# Patient Record
Sex: Female | Born: 1990 | Race: Black or African American | Hispanic: No | Marital: Single | Smoking: Current every day smoker
Health system: Southern US, Community
[De-identification: ages and names within clinical notes are randomized; demographics above are authoritative.]

## PROBLEM LIST (undated history)

## (undated) DIAGNOSIS — J45909 Unspecified asthma, uncomplicated: Secondary | ICD-10-CM

## (undated) HISTORY — PX: LIPOMA EXCISION: SHX5283

## (undated) HISTORY — PX: BACK SURGERY: SHX140

---

## 2017-01-02 ENCOUNTER — Other Ambulatory Visit: Payer: Self-pay

## 2017-01-02 ENCOUNTER — Emergency Department (HOSPITAL_BASED_OUTPATIENT_CLINIC_OR_DEPARTMENT_OTHER)
Admission: EM | Admit: 2017-01-02 | Discharge: 2017-01-02 | Disposition: A | Discharge status: Home / Self Care | Payer: Self-pay | Attending: Emergency Medicine | Admitting: Emergency Medicine

## 2017-01-02 ENCOUNTER — Encounter (HOSPITAL_BASED_OUTPATIENT_CLINIC_OR_DEPARTMENT_OTHER): Payer: Self-pay | Admitting: *Deleted

## 2017-01-02 DIAGNOSIS — J069 Acute upper respiratory infection, unspecified: Secondary | ICD-10-CM | POA: Insufficient documentation

## 2017-01-02 DIAGNOSIS — J45909 Unspecified asthma, uncomplicated: Secondary | ICD-10-CM | POA: Insufficient documentation

## 2017-01-02 DIAGNOSIS — B349 Viral infection, unspecified: Secondary | ICD-10-CM | POA: Insufficient documentation

## 2017-01-02 DIAGNOSIS — R05 Cough: Secondary | ICD-10-CM

## 2017-01-02 DIAGNOSIS — F17210 Nicotine dependence, cigarettes, uncomplicated: Secondary | ICD-10-CM | POA: Insufficient documentation

## 2017-01-02 DIAGNOSIS — R059 Cough, unspecified: Secondary | ICD-10-CM

## 2017-01-02 HISTORY — DX: Unspecified asthma, uncomplicated: J45.909

## 2017-01-02 MED ORDER — ALBUTEROL SULFATE HFA 108 (90 BASE) MCG/ACT IN AERS
2.0000 | INHALATION_SPRAY | Freq: Once | RESPIRATORY_TRACT | Status: AC
  Administered 2017-01-02: 2 via RESPIRATORY_TRACT
  Filled 2017-01-02: qty 6.7

## 2017-01-02 MED ORDER — IPRATROPIUM-ALBUTEROL 0.5-2.5 (3) MG/3ML IN SOLN
RESPIRATORY_TRACT | Status: AC
  Filled 2017-01-02: qty 3

## 2017-01-02 MED ORDER — BENZONATATE 100 MG PO CAPS: 100.0000 mg | ORAL_CAPSULE | Freq: Three times a day (TID) | ORAL | 0 refills | Status: DC

## 2017-01-02 MED ORDER — ALBUTEROL (5 MG/ML) CONTINUOUS INHALATION SOLN
INHALATION_SOLUTION | RESPIRATORY_TRACT | Status: AC
  Filled 2017-01-02: qty 0.5

## 2017-01-02 MED ORDER — PREDNISONE 20 MG PO TABS: 20.0000 mg | ORAL_TABLET | Freq: Every day | ORAL | 0 refills | Status: AC

## 2017-01-02 MED ORDER — ALBUTEROL SULFATE HFA 108 (90 BASE) MCG/ACT IN AERS: 1.0000 | INHALATION_SPRAY | Freq: Four times a day (QID) | RESPIRATORY_TRACT | 0 refills | Status: DC | PRN

## 2017-01-02 NOTE — ED Provider Notes (Signed)
Emergency Department Provider Note   I have reviewed the triage vital signs and the nursing notes.   HISTORY  Chief Complaint URI   HPI Hannah Haney is a 26 y.o. female with PMH of asthma presents to the emergency department for evaluation of cough, congestion, headache, sore throat, body aches over the past 3 days.  No recorded fevers at home.  No shaking chills.  She has a history of asthma and has been using her albuterol inhaler as needed with mild relief and cough symptoms.  She describes a burning sensation in her chest after coughing that resolves when she is not coughing.  Cough is nonproductive.  No hemoptysis.  No associated vomiting or nausea.  No sudden onset, maximal intensity headache symptoms.    Past Medical History:  Diagnosis Date  . Asthma     There are no active problems to display for this patient.   History reviewed. No pertinent surgical history.  Current Outpatient Rx  . Order #: [id number] Class: Historical Med  . Order #: [id number] Class: Historical Med  . Order #: [id number] Class: Print  . Order #: [id number] Class: Print  . Order #: [id number] Class: Print    Allergies Patient has no known allergies.  No family history on file.  Social History Social History   Tobacco Use  . Smoking status: Current Every Day Smoker    Packs/day: 0.50  Substance Use Topics  . Alcohol use: No    Frequency: Never  . Drug use: No    Review of Systems  Constitutional: No fever/chills. Positive body aches.  Eyes: No visual changes. ENT: Positive sore throat. Cardiovascular: Positive burning chest pain with cough.  Respiratory: Denies shortness of breath. Positive cough.  Gastrointestinal: No abdominal pain.  No nausea, no vomiting.  No diarrhea.  No constipation. Genitourinary: Negative for dysuria. Musculoskeletal: Negative for back pain. Skin: Negative for rash. Neurological: Negative for headaches, focal weakness or numbness.  10-point ROS  otherwise negative.  ____________________________________________   PHYSICAL EXAM:  VITAL SIGNS: ED Triage Vitals  Enc Vitals Group     BP 01/02/17 1742 117/79     Pulse Rate 01/02/17 1742 97     Resp 01/02/17 1742 16     Temp 01/02/17 1742 98.5 F (36.9 C)     Temp Source 01/02/17 1742 Oral     SpO2 01/02/17 1742 100 %     Weight 01/02/17 1740 240 lb (108.9 kg)     Height 01/02/17 1740 5\' 3"  (1.6 m)    Constitutional: Alert and oriented. Well appearing and in no acute distress. Eyes: Conjunctivae are normal.  Head: Atraumatic. Nose: No congestion/rhinnorhea. Mouth/Throat: Mucous membranes are moist.  Oropharynx with mild erythema.  Neck: No stridor. Mild cervical adenopathy.  Cardiovascular: Normal rate, regular rhythm. Good peripheral circulation. Grossly normal heart sounds.   Respiratory: Normal respiratory effort.  No retractions. Lungs CTAB. Gastrointestinal: Soft and nontender. No distention.  Musculoskeletal: No lower extremity tenderness nor edema. No gross deformities of extremities. Neurologic:  Normal speech and language. No gross focal neurologic deficits are appreciated.  Skin:  Skin is warm, dry and intact. No rash noted.  ____________________________________________  RADIOLOGY  None ____________________________________________   PROCEDURES  Procedure(s) performed:   Procedures  None ____________________________________________   INITIAL IMPRESSION / ASSESSMENT AND PLAN / ED COURSE  Pertinent labs & imaging results that were available during my care of the patient were reviewed by me and considered in my medical decision making (see chart for details).  Patient presents  to the emergency department for evaluation of cough, headache, sore throat, body aches over the past 3 days.  No hypoxemia.  Lungs are clear to auscultation bilaterally.  Not feel the patient would benefit from chest x-ray at this time given the clinical presentation.  Much more  consistent with viral infection, likely viral bronchitis.  Patient is outside of the window to benefit from Tamiflu, this was influenza.  Patient is very well-appearing.  Plan for albuterol inhaler in the emergency department with discharge home with short steroid course, albuterol, Tessalon Perles, and NSAIDs as needed for body aches.  Suspect that patient may be experiencing mild asthma flare symptoms in the setting of bronchitis.  At this time, I do not feel there is any life-threatening condition present. I have reviewed and discussed all results (EKG, imaging, lab, urine as appropriate), exam findings with patient. I have reviewed nursing notes and appropriate previous records.  I feel the patient is safe to be discharged home without further emergent workup. Discussed usual and customary return precautions. Patient and family (if present) verbalize understanding and are comfortable with this plan.  Patient will follow-up with their primary care provider. If they do not have a primary care provider, information for follow-up has been provided to them. All questions have been answered.   ____________________________________________  FINAL CLINICAL IMPRESSION(S) / ED DIAGNOSES  Final diagnoses:  Viral upper respiratory tract infection  Cough     MEDICATIONS GIVEN DURING THIS VISIT:  Medications  albuterol (PROVENTIL, VENTOLIN) (5 MG/ML) 0.5% continuous inhalation solution (not administered)  albuterol (PROVENTIL HFA;VENTOLIN HFA) 108 (90 Base) MCG/ACT inhaler 2 puff (not administered)     NEW OUTPATIENT MEDICATIONS STARTED DURING THIS VISIT:  Prednisone, Tessalon, Albuterol   Note:  This document was prepared using Dragon voice recognition software and may include unintentional dictation errors.  Alona BeneJoshua Natale Barba, MD Emergency Medicine    Larisha Vencill, Arlyss RepressJoshua G, MD 01/02/17 [phone]

## 2017-01-02 NOTE — Discharge Instructions (Signed)

## 2017-01-02 NOTE — ED Notes (Signed)
ED Provider at bedside. 

## 2017-01-02 NOTE — ED Triage Notes (Signed)
Pt c/o URI symptoms x 3 days 

## 2017-04-08 ENCOUNTER — Other Ambulatory Visit: Payer: Self-pay

## 2017-04-08 ENCOUNTER — Encounter (HOSPITAL_BASED_OUTPATIENT_CLINIC_OR_DEPARTMENT_OTHER): Payer: Self-pay | Admitting: Emergency Medicine

## 2017-04-08 ENCOUNTER — Emergency Department (HOSPITAL_BASED_OUTPATIENT_CLINIC_OR_DEPARTMENT_OTHER)
Admission: EM | Admit: 2017-04-08 | Discharge: 2017-04-09 | Disposition: A | Discharge status: Home / Self Care | Payer: Self-pay | Attending: Emergency Medicine | Admitting: Emergency Medicine

## 2017-04-08 DIAGNOSIS — F17200 Nicotine dependence, unspecified, uncomplicated: Secondary | ICD-10-CM | POA: Insufficient documentation

## 2017-04-08 DIAGNOSIS — H1011 Acute atopic conjunctivitis, right eye: Secondary | ICD-10-CM | POA: Insufficient documentation

## 2017-04-08 DIAGNOSIS — J45909 Unspecified asthma, uncomplicated: Secondary | ICD-10-CM | POA: Insufficient documentation

## 2017-04-08 DIAGNOSIS — Z79899 Other long term (current) drug therapy: Secondary | ICD-10-CM | POA: Insufficient documentation

## 2017-04-08 NOTE — ED Triage Notes (Signed)
L eye pain and redness x 2 days.

## 2017-04-09 ENCOUNTER — Encounter (HOSPITAL_BASED_OUTPATIENT_CLINIC_OR_DEPARTMENT_OTHER): Payer: Self-pay | Admitting: Emergency Medicine

## 2017-04-09 MED ORDER — ERYTHROMYCIN 5 MG/GM OP OINT: TOPICAL_OINTMENT | OPHTHALMIC | 0 refills | Status: DC

## 2017-04-09 MED ORDER — TETRACAINE HCL 0.5 % OP SOLN
OPHTHALMIC | Status: AC
  Filled 2017-04-09: qty 4

## 2017-04-09 MED ORDER — TETRACAINE HCL 0.5 % OP SOLN
2.0000 [drp] | Freq: Once | OPHTHALMIC | Status: AC
  Administered 2017-04-09: 2 [drp] via OPHTHALMIC

## 2017-04-09 MED ORDER — FLUORESCEIN SODIUM 1 MG OP STRP
1.0000 | ORAL_STRIP | Freq: Once | OPHTHALMIC | Status: AC
  Administered 2017-04-09: 1 via OPHTHALMIC

## 2017-04-09 MED ORDER — FLUORESCEIN SODIUM 1 MG OP STRP
ORAL_STRIP | OPHTHALMIC | Status: AC
  Filled 2017-04-09: qty 1

## 2017-04-09 NOTE — ED Provider Notes (Signed)
[facility] EMERGENCY DEPARTMENT Provider Note   CSN: [id number] Arrival date & time: 04/08/17  2328     History   Chief Complaint Chief Complaint  Patient presents with  . Eye Problem    HPI Hannah Haney is a [age] y.o. female.  The history is provided by the patient.  Eye Problem   This is a new problem. The current episode started 12 to 24 hours ago. The problem occurs constantly. The problem has not changed since onset.There is a problem in the left eye. There was no injury mechanism. The pain is at a severity of 0/10. There is no history of trauma to the eye. There is no known exposure to pink eye. She does not wear contacts. Pertinent negatives include no numbness, no blurred vision, no decreased vision, no discharge, no double vision, no foreign body sensation, no photophobia, no eye redness, no nausea, no vomiting, no tingling, no weakness and no itching. She has tried nothing for the symptoms. The treatment provided no relief.    Past Medical History:  Diagnosis Date  . Asthma     There are no active problems to display for this patient.   Past Surgical History:  Procedure Laterality Date  . LIPOMA EXCISION      OB History    No data available       Home Medications    Prior to Admission medications   Medication Sig Start Date End Date Taking? Authorizing Provider  albuterol (PROVENTIL HFA;VENTOLIN HFA) 108 (90 Base) MCG/ACT inhaler Inhale 2 puffs into the lungs every 6 (six) hours as needed for wheezing or shortness of breath.    [provider]  albuterol (PROVENTIL HFA;VENTOLIN HFA) 108 (90 Base) MCG/ACT inhaler Inhale 1-2 puffs into the lungs every 6 (six) hours as needed for wheezing or shortness of breath. 01/02/17   Long, Arlyss RepressJoshua G, MD  benzonatate (TESSALON) 100 MG capsule Take 1 capsule (100 mg total) by mouth every 8 (eight) hours. 01/02/17   Long, Arlyss RepressJoshua G, MD  erythromycin ophthalmic ointment Place a 1/2 inch ribbon of ointment  into the lower eyelid bid. 04/09/17   Rolande Moe, MD  fluticasone (FLOVENT HFA) 220 MCG/ACT inhaler Inhale 2 puffs into the lungs 2 (two) times daily.    [provider]    Family History No family history on file.  Social History Social History   Tobacco Use  . Smoking status: Current Every Day Smoker    Packs/day: 0.50  . Smokeless tobacco: Never Used  Substance Use Topics  . Alcohol use: Yes    Frequency: Never  . Drug use: No     Allergies   Patient has no known allergies.   Review of Systems Review of Systems  Constitutional: Negative for fever.  HENT: Negative for ear pain and voice change.   Eyes: Positive for itching. Negative for blurred vision, double vision, photophobia, pain, discharge, redness and visual disturbance.  Respiratory: Negative for cough.   Cardiovascular: Negative for chest pain.  Gastrointestinal: Negative for nausea and vomiting.  Skin: Negative for itching.  Neurological: Negative for tingling, weakness and numbness.  All other systems reviewed and are negative.    Physical Exam Updated Vital Signs BP 125/74 (BP Location: Left Arm)   Pulse 98   Temp 99.6 F (37.6 C) (Oral)   Resp 16   Ht 5\' 3"  (1.6 m)   Wt 117.9 kg (260 lb)   LMP 03/31/2017   SpO2 98%   BMI 46.06  kg/m   Physical Exam  Constitutional: She is oriented to person, place, and time. She appears well-developed and well-nourished. No distress.  HENT:  Head: Normocephalic and atraumatic.  Mouth/Throat: No oropharyngeal exudate.  Eyes: EOM are normal. Pupils are equal, round, and reactive to light. Left eye exhibits no chemosis, no discharge and no exudate. Left conjunctiva is injected. Left conjunctiva has no hemorrhage. Left eye exhibits normal extraocular motion and no nystagmus.    Neck: Normal range of motion. Neck supple.  Cardiovascular: Normal rate, regular rhythm, normal heart sounds and intact distal pulses.  Pulmonary/Chest: Effort normal and  breath sounds normal. No stridor. She has no wheezes.  Abdominal: Soft. Bowel sounds are normal. There is no tenderness. There is no rebound and no guarding.  Musculoskeletal: Normal range of motion.  Neurological: She is alert and oriented to person, place, and time. She displays normal reflexes.  Skin: Skin is warm and dry. Capillary refill takes less than 2 seconds.  Psychiatric: She has a normal mood and affect.  Nursing note and vitals reviewed.    ED Treatments / Results    Procedures Procedures (including critical care time)  Medications Ordered in ED Medications  fluorescein ophthalmic strip 1 strip (1 strip Right Eye Given by Other 04/09/17 0014)  tetracaine (PONTOCAINE) 0.5 % ophthalmic solution 2 drop (2 drops Right Eye Given by Other 04/09/17 0014)       Final Clinical Impressions(s) / ED Diagnoses   Final diagnoses:  Allergic conjunctivitis of right eye   Return for weakness, numbness, changes in vision or speech, fevers >100.4 unrelieved by medication, shortness of breath, intractable vomiting, or diarrhea, abdominal pain, Inability to tolerate liquids or food, cough, altered mental status or any concerns. No signs of systemic illness or infection. The patient is nontoxic-appearing on exam and vital signs are within normal limits.   I have reviewed the triage vital signs and the nursing notes. Pertinent labs &imaging results that were available during my care of the patient were reviewed by me and considered in my medical decision making (see chart for details).  After history, exam, and medical workup I feel the patient has been appropriately medically screened and is safe for discharge home. Pertinent diagnoses were discussed with the patient. Patient was given return precautions.   ED Discharge Orders        Ordered    erythromycin ophthalmic ointment     04/09/17 0019       Brooklyn Alfredo, MD 04/09/17 [id number]

## 2017-09-30 ENCOUNTER — Emergency Department (HOSPITAL_COMMUNITY): Payer: 59

## 2017-09-30 ENCOUNTER — Encounter (HOSPITAL_COMMUNITY): Payer: Self-pay | Admitting: Emergency Medicine

## 2017-09-30 ENCOUNTER — Emergency Department (HOSPITAL_COMMUNITY)
Admission: EM | Admit: 2017-09-30 | Discharge: 2017-09-30 | Disposition: A | Discharge status: Home / Self Care | Payer: 59 | Attending: Emergency Medicine | Admitting: Emergency Medicine

## 2017-09-30 DIAGNOSIS — F17200 Nicotine dependence, unspecified, uncomplicated: Secondary | ICD-10-CM | POA: Insufficient documentation

## 2017-09-30 DIAGNOSIS — M549 Dorsalgia, unspecified: Secondary | ICD-10-CM | POA: Diagnosis present

## 2017-09-30 DIAGNOSIS — M546 Pain in thoracic spine: Secondary | ICD-10-CM | POA: Diagnosis not present

## 2017-09-30 DIAGNOSIS — Z79899 Other long term (current) drug therapy: Secondary | ICD-10-CM | POA: Insufficient documentation

## 2017-09-30 DIAGNOSIS — J45909 Unspecified asthma, uncomplicated: Secondary | ICD-10-CM | POA: Diagnosis not present

## 2017-09-30 MED ORDER — METHOCARBAMOL 500 MG PO TABS: 500.0000 mg | ORAL_TABLET | Freq: Two times a day (BID) | ORAL | 0 refills | Status: DC

## 2017-09-30 MED ORDER — KETOROLAC TROMETHAMINE 60 MG/2ML IM SOLN
30.0000 mg | Freq: Once | INTRAMUSCULAR | Status: AC
  Administered 2017-09-30: 30 mg via INTRAMUSCULAR
  Filled 2017-09-30: qty 2

## 2017-09-30 MED ORDER — PREDNISONE 10 MG PO TABS: 40.0000 mg | ORAL_TABLET | Freq: Every day | ORAL | 0 refills | Status: DC

## 2017-09-30 NOTE — ED Provider Notes (Signed)
[facility]-EMERGENCY DEPT Provider Note   CSN: [id number] Arrival date & time: 09/30/17  1749     History   Chief Complaint Chief Complaint  Patient presents with  . Back Pain    HPI Hannah Haney is a 27 y.o. female who presents to the ED with back pain. The back pain is located under the shoulder blades and started before over a month ago. Patient reports having been evaluated at [facility] at the beginning of August for same.   HPI  Past Medical History:  Diagnosis Date  . Asthma     There are no active problems to display for this patient.   Past Surgical History:  Procedure Laterality Date  . LIPOMA EXCISION       OB History   None      Home Medications    Prior to Admission medications   Medication Sig Start Date End Date Taking? Authorizing Provider  albuterol (PROVENTIL HFA;VENTOLIN HFA) 108 (90 Base) MCG/ACT inhaler Inhale 2 puffs into the lungs every 6 (six) hours as needed for wheezing or shortness of breath.    [provider]  albuterol (PROVENTIL HFA;VENTOLIN HFA) 108 (90 Base) MCG/ACT inhaler Inhale 1-2 puffs into the lungs every 6 (six) hours as needed for wheezing or shortness of breath. 01/02/17   Long, Arlyss Repress, MD  benzonatate (TESSALON) 100 MG capsule Take 1 capsule (100 mg total) by mouth every 8 (eight) hours. 01/02/17   Long, Arlyss Repress, MD  erythromycin ophthalmic ointment Place a 1/2 inch ribbon of ointment into the lower eyelid bid. 04/09/17   Palumbo, April, MD  fluticasone (FLOVENT HFA) 220 MCG/ACT inhaler Inhale 2 puffs into the lungs 2 (two) times daily.    [provider]  methocarbamol (ROBAXIN) 500 MG tablet Take 1 tablet (500 mg total) by mouth 2 (two) times daily. 09/30/17   Janne Napoleon, NP  predniSONE (DELTASONE) 10 MG tablet Take 4 tablets (40 mg total) by mouth daily. 09/30/17   Janne Napoleon, NP    Family History No family history on file.  Social History Social History   Tobacco Use  .  Smoking status: Current Every Day Smoker    Packs/day: 0.50  . Smokeless tobacco: Never Used  Substance Use Topics  . Alcohol use: Yes    Frequency: Never  . Drug use: No     Allergies   Patient has no known allergies.   Review of Systems Review of Systems  Musculoskeletal: Positive for back pain.  All other systems reviewed and are negative.    Physical Exam Updated Vital Signs BP 132/77   Pulse 76   Temp 98.7 F (37.1 C) (Oral)   Resp 15   LMP 08/14/2017   SpO2 98%   Physical Exam  Constitutional: She appears well-developed and well-nourished. No distress.  HENT:  Head: Normocephalic.  Eyes: EOM are normal.  Neck: Neck supple.  Cardiovascular: Normal rate.  Pulmonary/Chest: Effort normal.  Abdominal: Soft. There is no tenderness.  Musculoskeletal:       Thoracic back: She exhibits tenderness and spasm. She exhibits normal pulse.  Lipoma noted to the right lower back.  Neurological: She is alert. She has normal strength and normal reflexes. No sensory deficit. Gait normal.  Skin: Skin is warm and dry.  Psychiatric: She has a normal mood and affect.  Nursing note and vitals reviewed.    ED Treatments / Results  Labs (all labs ordered are listed, but only abnormal results  are displayed) Labs Reviewed - No data to display Radiology Dg Thoracic Spine 2 View  Result Date: 09/30/2017 CLINICAL DATA:  Mid back pain x1 month. EXAM: THORACIC SPINE 2 VIEWS COMPARISON:  None. FINDINGS: There is no evidence of thoracic spine fracture. Twelve ribbed thoracic vertebrae are noted. Alignment is normal. Slight T3-4 and T4-5 disc flattening. No other significant bone abnormalities are identified. IMPRESSION: Slight upper thoracic disc flattening T3 through T5. No acute osseous abnormality. Electronically Signed   By: Tollie Eth M.D.   On: 09/30/2017 20:08    Procedures Procedures (including critical care time)  Medications Ordered in ED Medications  ketorolac (TORADOL)  injection 30 mg (has no administration in time range)     Initial Impression / Assessment and Plan / ED Course  I have reviewed the triage vital signs and the nursing notes. Patient with back pain.  No neurological deficits and normal neuro exam.  Patient can walk but states is painful.  No loss of bowel or bladder control.  No concern for cauda equina.  No fever, night sweats, weight loss, h/o cancer, IVDU.  RICE protocol and pain medicine indicated and discussed with patient.   Final Clinical Impressions(s) / ED Diagnoses   Final diagnoses:  Acute right-sided thoracic back pain    ED Discharge Orders         Ordered    predniSONE (DELTASONE) 10 MG tablet  Daily     09/30/17 2013    methocarbamol (ROBAXIN) 500 MG tablet  2 times daily     09/30/17 2013           Kerrie [place], [place] 09/30/17 2025    Benjiman Core, MD 10/01/17 [phone]

## 2017-09-30 NOTE — ED Triage Notes (Signed)
Pt c/o mid back pain on spine under shoulder blades since before August. Pt reports she has been seen at [facility] at the beginning of August for same pain.

## 2018-06-15 ENCOUNTER — Emergency Department (HOSPITAL_COMMUNITY)
Admission: EM | Admit: 2018-06-15 | Discharge: 2018-06-15 | Disposition: A | Discharge status: Home / Self Care | Payer: 59 | Attending: Emergency Medicine | Admitting: Emergency Medicine

## 2018-06-15 ENCOUNTER — Other Ambulatory Visit: Payer: Self-pay

## 2018-06-15 ENCOUNTER — Encounter (HOSPITAL_COMMUNITY): Payer: Self-pay | Admitting: Emergency Medicine

## 2018-06-15 ENCOUNTER — Emergency Department (HOSPITAL_COMMUNITY): Payer: 59

## 2018-06-15 DIAGNOSIS — R1030 Lower abdominal pain, unspecified: Secondary | ICD-10-CM | POA: Diagnosis present

## 2018-06-15 DIAGNOSIS — F17210 Nicotine dependence, cigarettes, uncomplicated: Secondary | ICD-10-CM | POA: Diagnosis not present

## 2018-06-15 LAB — I-STAT BETA HCG BLOOD, ED (MC, WL, AP ONLY): I-stat hCG, quantitative: <5 m[IU]/mL (ref <5)

## 2018-06-15 LAB — URINALYSIS, ROUTINE W REFLEX MICROSCOPIC
Bilirubin Urine: NEGATIVE
Glucose, UA: NEGATIVE mg/dL
Ketones, ur: NEGATIVE mg/dL
Leukocytes,Ua: NEGATIVE
Nitrite: NEGATIVE
Protein, ur: NEGATIVE mg/dL
Specific Gravity, Urine: 1.026 (ref 1.005–1.030)
pH: 5 (ref 5.0–8.0)

## 2018-06-15 LAB — WET PREP, GENITAL
Clue Cells Wet Prep HPF POC: NONE SEEN
Sperm: NONE SEEN
Trich, Wet Prep: NONE SEEN
Yeast Wet Prep HPF POC: NONE SEEN

## 2018-06-15 LAB — COMPREHENSIVE METABOLIC PANEL
ALT: 23 U/L (ref 0–44)
AST: 19 U/L (ref 15–41)
Albumin: 3.5 g/dL (ref 3.5–5.0)
Alkaline Phosphatase: 68 U/L (ref 38–126)
Anion gap: 8 (ref 5–15)
BUN: 11 mg/dL (ref 6–20)
CO2: 25 mmol/L (ref 22–32)
Calcium: 9.2 mg/dL (ref 8.9–10.3)
Chloride: 106 mmol/L (ref 98–111)
Creatinine, Ser: 0.76 mg/dL (ref 0.44–1.00)
GFR calc Af Amer: >60 mL/min (ref >60)
GFR calc non Af Amer: >60 mL/min (ref >60)
Glucose, Bld: 100 mg/dL — ABNORMAL HIGH (ref 70–99)
Potassium: 4.2 mmol/L (ref 3.5–5.1)
Sodium: 139 mmol/L (ref 135–145)
Total Bilirubin: 0.5 mg/dL (ref 0.3–1.2)
Total Protein: 7.1 g/dL (ref 6.5–8.1)

## 2018-06-15 LAB — CBC
HCT: 43.9 % (ref 36.0–46.0)
Hemoglobin: 13.7 g/dL (ref 12.0–15.0)
MCH: 26 pg (ref 26.0–34.0)
MCHC: 31.2 g/dL (ref 30.0–36.0)
MCV: 83.3 fL (ref 80.0–100.0)
Platelets: 463 10*3/uL — ABNORMAL HIGH (ref 150–400)
RBC: 5.27 MIL/uL — ABNORMAL HIGH (ref 3.87–5.11)
RDW: 14 % (ref 11.5–15.5)
WBC: 18 10*3/uL — ABNORMAL HIGH (ref 4.0–10.5)
nRBC: 0 % (ref 0.0–0.2)

## 2018-06-15 LAB — LIPASE, BLOOD: Lipase: 29 U/L (ref 11–51)

## 2018-06-15 MED ORDER — SODIUM CHLORIDE 0.9% FLUSH
3.0000 mL | Freq: Once | INTRAVENOUS | Status: AC
  Administered 2018-06-15: 3 mL via INTRAVENOUS

## 2018-06-15 MED ORDER — DICYCLOMINE HCL 20 MG PO TABS: 20.0000 mg | ORAL_TABLET | Freq: Two times a day (BID) | ORAL | 0 refills | Status: DC

## 2018-06-15 MED ORDER — IOHEXOL 300 MG/ML  SOLN
100.0000 mL | Freq: Once | INTRAMUSCULAR | Status: AC | PRN
  Administered 2018-06-15: 100 mL via INTRAVENOUS

## 2018-06-15 MED ORDER — POLYETHYLENE GLYCOL 3350 17 G PO PACK: 17.0000 g | PACK | Freq: Every day | ORAL | 0 refills | Status: DC

## 2018-06-15 NOTE — Discharge Instructions (Signed)
Take MiraLAX daily mixed with Gatorade until you are having regular bowel movements.  Take Bentyl twice daily as needed for abdominal cramping.  Please follow-up with your primary care provider by calling the number circled on your discharge paperwork.  Please follow-up with an OB/GYN by calling the women's outpatient clinic.  Please return to the emergency department if you develop any new or worsening symptoms.  Your lipoma was seen on CT today and it was recommended that you have it imaged again in 3 to 4 months to assess its stability.

## 2018-06-15 NOTE — ED Notes (Signed)
Patient transported to CT 

## 2018-06-15 NOTE — ED Provider Notes (Signed)
[facility] EMERGENCY DEPARTMENT Provider Note   CSN: [id number] Arrival date & time: 06/15/18  1541    History   Chief Complaint Chief Complaint  Patient presents with   Abdominal Pain    HPI Hannah Haney is a 28 y.o. female with history of asthma who presents with a 4-day history of lower abdominal pain.  Patient describes the pain as crampy.  It has radiated to her back at times.  She denies any associated fevers, chest pain, shortness of breath, nausea, vomiting, diarrhea, abnormal vaginal bleeding or discharge.  She is sexually active, but has no concern for STD exposure.  LMP 06/06/2018.  She has taken ibuprofen at home with some relief.  She denies any urinary symptoms.     HPI  Past Medical History:  Diagnosis Date   Asthma     There are no active problems to display for this patient.   Past Surgical History:  Procedure Laterality Date   LIPOMA EXCISION       OB History   No obstetric history on file.      Home Medications    Prior to Admission medications   Medication Sig Start Date End Date Taking? Authorizing Provider  dicyclomine (BENTYL) 20 MG tablet Take 1 tablet (20 mg total) by mouth 2 (two) times daily. 06/15/18   Loyola Santino, Waylan Boga, PA-C  polyethylene glycol (MIRALAX) 17 g packet Take 17 g by mouth daily. 06/15/18   Emi Holes, PA-C    Family History History reviewed. No pertinent family history.  Social History Social History   Tobacco Use   Smoking status: Current Every Day Smoker    Packs/day: 0.50   Smokeless tobacco: Never Used  Substance Use Topics   Alcohol use: Yes    Frequency: Never   Drug use: No     Allergies   Patient has no known allergies.   Review of Systems Review of Systems  Constitutional: Negative for chills and fever.  HENT: Negative for facial swelling and sore throat.   Respiratory: Negative for shortness of breath.   Cardiovascular: Negative for chest pain.    Gastrointestinal: Positive for abdominal pain. Negative for nausea and vomiting.  Genitourinary: Negative for dysuria.  Musculoskeletal: Positive for back pain.  Skin: Negative for rash and wound.  Neurological: Negative for headaches.  Psychiatric/Behavioral: The patient is not nervous/anxious.      Physical Exam Updated Vital Signs BP 119/68 (BP Location: Right Arm)    Pulse 60    Temp 98.9 F (37.2 C) (Oral)    Resp 16    Ht  (1.6 m)    Wt 127 kg    LMP 06/06/2018    SpO2 100%    BMI 49.60 kg/m   Physical Exam Vitals signs and nursing note reviewed.  Constitutional:      General: She is not in acute distress.    Appearance: She is well-developed. She is not diaphoretic.  HENT:     Head: Normocephalic and atraumatic.     Mouth/Throat:     Pharynx: No oropharyngeal exudate.  Eyes:     General: No scleral icterus.       Right eye: No discharge.        Left eye: No discharge.     Conjunctiva/sclera: Conjunctivae normal.     Pupils: Pupils are equal, round, and reactive to light.  Neck:     Musculoskeletal: Normal range of motion and neck supple.     Thyroid:  No thyromegaly.  Cardiovascular:     Rate and Rhythm: Normal rate and regular rhythm.     Heart sounds: Normal heart sounds. No murmur. No friction rub. No gallop.   Pulmonary:     Effort: Pulmonary effort is normal. No respiratory distress.     Breath sounds: Normal breath sounds. No stridor. No wheezing or rales.  Abdominal:     General: Bowel sounds are normal. There is no distension.     Palpations: Abdomen is soft.     Tenderness: There is abdominal tenderness (R>L) in the right lower quadrant and left lower quadrant. There is no right CVA tenderness, left CVA tenderness, guarding or rebound. Positive signs include McBurney's sign (patient winces with RLQ palpation and not LLQ). Negative signs include Rovsing's sign.  Lymphadenopathy:     Cervical: No cervical adenopathy.  Skin:    General: Skin is warm  and dry.     Coloration: Skin is not pale.     Findings: No rash.  Neurological:     Mental Status: She is alert.     Coordination: Coordination normal.      ED Treatments / Results  Labs (all labs ordered are listed, but only abnormal results are displayed) Labs Reviewed  WET PREP, GENITAL - Abnormal; Notable for the following components:      Result Value   WBC, Wet Prep HPF POC MODERATE (*)    All other components within normal limits  COMPREHENSIVE METABOLIC PANEL - Abnormal; Notable for the following components:   Glucose, Bld 100 (*)    All other components within normal limits  CBC - Abnormal; Notable for the following components:   WBC 18.0 (*)    RBC 5.27 (*)    Platelets 463 (*)    All other components within normal limits  URINALYSIS, ROUTINE W REFLEX MICROSCOPIC - Abnormal; Notable for the following components:   APPearance HAZY (*)    Hgb urine dipstick SMALL (*)    Bacteria, UA RARE (*)    All other components within normal limits  LIPASE, BLOOD  I-STAT BETA HCG BLOOD, ED ([facility], [facility], [facility] ONLY)  GC/CHLAMYDIA PROBE AMP ([place]) NOT AT [facility]    EKG None  Radiology US Transvaginal Non-ob  Result Date: 06/15/2018 CLINICAL DATA:  28 year old female with pain and cramping for 4 days. Unrevealing CT Abdomen and Pelvis earlier today. LMP 06/04/2012. EXAM: TRANSABDOMINAL AND TRANSVAGINAL ULTRASOUND OF PELVIS DOPPLER ULTRASOUND OF OVARIES TECHNIQUE: Both transabdominal and transvaginal ultrasound examinations of the pelvis were performed. Transabdominal technique was performed for global imaging of the pelvis including uterus, ovaries, adnexal regions, and pelvic cul-de-sac. It was necessary to proceed with endovaginal exam following the transabdominal exam to visualize the ovaries. Color and duplex Doppler ultrasound was utilized to evaluate blood flow to the ovaries. COMPARISON:  CT Abdomen and Pelvis 2023 hours today. FINDINGS: Uterus Measurements: 8.7 x 3.9 x 4.0  centimeters = volume: 72 mL. No fibroids or other mass visualized. Endometrium Thickness: 13 millimeters.  No focal abnormality visualized. Right ovary Measurements: 1.9 x 1.4 x 1.2 centimeters = volume: 2 mL. Small 10 millimeter follicle suspected on image 21. Left ovary Measurements: 1.7 x 2.7 x 1.9 centimeters = volume: 5 mL. Small 12 millimeter follicle on image 26. Pulsed Doppler evaluation of both ovaries demonstrates normal low-resistance arterial and venous waveforms. Other findings Trace if any pelvic free fluid. IMPRESSION: Negative ultrasound appearance of the female pelvis with no evidence of ovarian torsion. Electronically Signed   By:  Odessa Fleming M.D.   On: 06/15/2018 21:51   US Pelvis Complete  Result Date: 06/15/2018 CLINICAL DATA:  28 year old female with pain and cramping for 4 days. Unrevealing CT Abdomen and Pelvis earlier today. LMP 06/04/2012. EXAM: TRANSABDOMINAL AND TRANSVAGINAL ULTRASOUND OF PELVIS DOPPLER ULTRASOUND OF OVARIES TECHNIQUE: Both transabdominal and transvaginal ultrasound examinations of the pelvis were performed. Transabdominal technique was performed for global imaging of the pelvis including uterus, ovaries, adnexal regions, and pelvic cul-de-sac. It was necessary to proceed with endovaginal exam following the transabdominal exam to visualize the ovaries. Color and duplex Doppler ultrasound was utilized to evaluate blood flow to the ovaries. COMPARISON:  CT Abdomen and Pelvis 2023 hours today. FINDINGS: Uterus Measurements: 8.7 x 3.9 x 4.0 centimeters = volume: 72 mL. No fibroids or other mass visualized. Endometrium Thickness: 13 millimeters.  No focal abnormality visualized. Right ovary Measurements: 1.9 x 1.4 x 1.2 centimeters = volume: 2 mL. Small 10 millimeter follicle suspected on image 21. Left ovary Measurements: 1.7 x 2.7 x 1.9 centimeters = volume: 5 mL. Small 12 millimeter follicle on image 26. Pulsed Doppler evaluation of both ovaries demonstrates normal  low-resistance arterial and venous waveforms. Other findings Trace if any pelvic free fluid. IMPRESSION: Negative ultrasound appearance of the female pelvis with no evidence of ovarian torsion. Electronically Signed   By: Odessa Fleming M.D.   On: 06/15/2018 21:51   Ct Abdomen Pelvis W Contrast  Result Date: 06/15/2018 CLINICAL DATA:  Abdominal pain and cramping for 4 days EXAM: CT ABDOMEN AND PELVIS WITH CONTRAST TECHNIQUE: Multidetector CT imaging of the abdomen and pelvis was performed using the standard protocol following bolus administration of intravenous contrast. CONTRAST:  OMNIPAQUE IOHEXOL 300 MG/ML  SOLN COMPARISON:  None. FINDINGS: Lower chest: Lung bases are clear. Hepatobiliary: No focal liver lesions are apparent. The gallbladder wall is not appreciably thickened. There is no biliary duct dilatation. Pancreas: There is no pancreatic mass or inflammatory focus. Spleen: No splenic lesions are evident. Adrenals/Urinary Tract: Adrenals bilaterally appear unremarkable. Kidneys bilaterally show no evident mass or hydronephrosis on either side. There is a junctional parenchymal defect on the left, an anatomic variant. There is no evident renal or ureteral calculus on either side. Urinary bladder is midline with wall thickness within normal limits. Stomach/Bowel: There is no appreciable bowel wall or mesenteric thickening. There is no evident bowel obstruction. There is moderate stool in the colon. The terminal ileum appears normal. There is no free air or portal venous air. Vascular/Lymphatic: There is no abdominal aortic aneurysm. No vascular lesions are evident. There is no adenopathy in the abdomen or pelvis. Reproductive: The uterus is anteverted. There is no demonstrable pelvic mass. Other: Appendix appears normal. There is no abscess or ascites in the abdomen or pelvis. There is a sizable lipoma in the posterior abdominal wall at the mid kidney level measuring 15.7 x 10.1 cm. There is apparent  scarring in the midportion of this lipoma. Musculoskeletal: There are no blastic or lytic bone lesions. No intramuscular lesions are evident. IMPRESSION: 1. Appendix appears normal. No evident bowel obstruction. No abscess in the abdomen or pelvis. 2. 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 There is what appears to be scarring in the midportion of this area of apparent lipoma. This area does not appear nodular. Suspect scarring secondary to previous trauma in this area. Neoplastic degeneration is possible in this area. Given this  appearance, a follow-up CT in 3-4 months may be advisable to assess for stability. 3. No evident renal or ureteral calculus. No hydronephrosis. Urinary bladder wall thickness is within normal limits. Electronically Signed   By: Bretta BangWilliam  Woodruff III M.D.   On: 06/15/2018 20:56   [place] Art/ven Flow Abd Pelv Doppler  Result Date: 06/15/2018 CLINICAL DATA:  28 year old female with pain and cramping for 4 days. Unrevealing CT Abdomen and Pelvis earlier today. LMP 06/04/2012. EXAM: TRANSABDOMINAL AND TRANSVAGINAL ULTRASOUND OF PELVIS DOPPLER ULTRASOUND OF OVARIES TECHNIQUE: Both transabdominal and transvaginal ultrasound examinations of the pelvis were performed. Transabdominal technique was performed for global imaging of the pelvis including uterus, ovaries, adnexal regions, and pelvic cul-de-sac. It was necessary to proceed with endovaginal exam following the transabdominal exam to visualize the ovaries. Color and duplex Doppler ultrasound was utilized to evaluate blood flow to the ovaries. COMPARISON:  CT Abdomen and Pelvis 2023 hours today. FINDINGS: Uterus Measurements: 8.7 x 3.9 x 4.0 centimeters = volume: 72 mL. No fibroids or other mass visualized. Endometrium Thickness: 13 millimeters.  No focal abnormality visualized. Right ovary Measurements: 1.9 x 1.4 x 1.2 centimeters = volume: 2  mL. Small 10 millimeter follicle suspected on image 21. Left ovary Measurements: 1.7 x 2.7 x 1.9 centimeters = volume: 5 mL. Small 12 millimeter follicle on image 26. Pulsed Doppler evaluation of both ovaries demonstrates normal low-resistance arterial and venous waveforms. Other findings Trace if any pelvic free fluid. IMPRESSION: Negative ultrasound appearance of the female pelvis with no evidence of ovarian torsion. Electronically Signed   By: Odessa FlemingH  Hall M.D.   On: 06/15/2018 21:51    Procedures Procedures (including critical care time)  Medications Ordered in ED Medications  sodium chloride flush (NS) 0.9 % injection 3 mL (3 mLs Intravenous Given 06/15/18 1904)  iohexol (OMNIPAQUE) 300 MG/ML solution 100 mL (100 mLs Intravenous Contrast Given 06/15/18 2023)     Initial Impression / Assessment and Plan / ED Course  I have reviewed the triage vital signs and the nursing notes.  Pertinent labs & imaging results that were available during my care of the patient were reviewed by me and considered in my medical decision making (see chart for details).        Patient presenting with a several day history of lower abdominal pain.  Patient had McBurney's point tenderness.  CT abdomen pelvis shows normal appendix, but a lipoma in the right posterior abdominal wall.  Patient reports she has had this operated on before and needs it operated on again.  She is advised that the radiologist recommended a follow-up CT in 3 to 4 months.  Patient will be given follow-up to establish care with a PCP as she just moved here recently.  Pelvic ultrasound is negative.  CT does show moderate stool in the colon, this could be a reason for patient's pain.  Patient has moderate leukocytosis, however she states she does have a baseline leukocytosis.  She is unsure of her baseline.  Per chart review, I could not find a baseline considering patient just moved here.  GC/chlamydia sent and pending.  Wet prep shows moderate WBCs,  but no other findings.  Patient be discharged home with MiraLAX and Bentyl.  Patient we are given referral to PCP as well as OB/GYN.  Return precautions discussed.  Patient understands and agrees with plan.  Patient vitals stable throughout ED course and discharged in satisfactory condition.  Final Clinical Impressions(s) / ED Diagnoses   Final diagnoses:  Lower  abdominal pain    ED Discharge Orders         Ordered    polyethylene glycol (MIRALAX) 17 g packet  Daily     06/15/18 2217    dicyclomine (BENTYL) 20 MG tablet  2 times daily     06/15/18 2217           Emi Holes, PA-C 06/15/18 2330    Maia Plan, MD 06/16/18 1949

## 2018-06-15 NOTE — ED Triage Notes (Signed)
Pt states she has been having abd pain/cramping for 4 days. Denies any bleeding. Denies N/v. Denies urinary symptoms

## 2018-06-18 LAB — GC/CHLAMYDIA PROBE AMP (~~LOC~~) NOT AT ARMC
Chlamydia: NEGATIVE
Neisseria Gonorrhea: NEGATIVE

## 2019-01-11 ENCOUNTER — Encounter (HOSPITAL_COMMUNITY): Payer: Self-pay | Admitting: Emergency Medicine

## 2019-01-11 ENCOUNTER — Other Ambulatory Visit: Payer: Self-pay

## 2019-01-11 ENCOUNTER — Emergency Department (HOSPITAL_COMMUNITY): Payer: 59

## 2019-01-11 ENCOUNTER — Emergency Department (HOSPITAL_COMMUNITY)
Admission: EM | Admit: 2019-01-11 | Discharge: 2019-01-11 | Disposition: A | Discharge status: Home / Self Care | Payer: 59 | Attending: Emergency Medicine | Admitting: Emergency Medicine

## 2019-01-11 DIAGNOSIS — F17210 Nicotine dependence, cigarettes, uncomplicated: Secondary | ICD-10-CM | POA: Diagnosis not present

## 2019-01-11 DIAGNOSIS — B9689 Other specified bacterial agents as the cause of diseases classified elsewhere: Secondary | ICD-10-CM | POA: Insufficient documentation

## 2019-01-11 DIAGNOSIS — N760 Acute vaginitis: Secondary | ICD-10-CM | POA: Diagnosis not present

## 2019-01-11 DIAGNOSIS — R1033 Periumbilical pain: Secondary | ICD-10-CM | POA: Diagnosis present

## 2019-01-11 LAB — COMPREHENSIVE METABOLIC PANEL
ALT: 18 U/L (ref 0–44)
AST: 15 U/L (ref 15–41)
Albumin: 3.6 g/dL (ref 3.5–5.0)
Alkaline Phosphatase: 69 U/L (ref 38–126)
Anion gap: 8 (ref 5–15)
BUN: 10 mg/dL (ref 6–20)
CO2: 20 mmol/L — ABNORMAL LOW (ref 22–32)
Calcium: 9 mg/dL (ref 8.9–10.3)
Chloride: 108 mmol/L (ref 98–111)
Creatinine, Ser: 0.76 mg/dL (ref 0.44–1.00)
GFR calc Af Amer: >60 mL/min (ref >60)
GFR calc non Af Amer: >60 mL/min (ref >60)
Glucose, Bld: 92 mg/dL (ref 70–99)
Potassium: 4.3 mmol/L (ref 3.5–5.1)
Sodium: 136 mmol/L (ref 135–145)
Total Bilirubin: 0.3 mg/dL (ref 0.3–1.2)
Total Protein: 7 g/dL (ref 6.5–8.1)

## 2019-01-11 LAB — URINALYSIS, ROUTINE W REFLEX MICROSCOPIC
Bilirubin Urine: NEGATIVE
Glucose, UA: NEGATIVE mg/dL
Ketones, ur: NEGATIVE mg/dL
Leukocytes,Ua: NEGATIVE
Nitrite: NEGATIVE
Protein, ur: NEGATIVE mg/dL
Specific Gravity, Urine: 1.021 (ref 1.005–1.030)
pH: 6 (ref 5.0–8.0)

## 2019-01-11 LAB — CBC
HCT: 44.3 % (ref 36.0–46.0)
Hemoglobin: 13.5 g/dL (ref 12.0–15.0)
MCH: 25.8 pg — ABNORMAL LOW (ref 26.0–34.0)
MCHC: 30.5 g/dL (ref 30.0–36.0)
MCV: 84.5 fL (ref 80.0–100.0)
Platelets: 434 10*3/uL — ABNORMAL HIGH (ref 150–400)
RBC: 5.24 MIL/uL — ABNORMAL HIGH (ref 3.87–5.11)
RDW: 13.8 % (ref 11.5–15.5)
WBC: 12.6 10*3/uL — ABNORMAL HIGH (ref 4.0–10.5)
nRBC: 0 % (ref 0.0–0.2)

## 2019-01-11 LAB — WET PREP, GENITAL
Sperm: NONE SEEN
Trich, Wet Prep: NONE SEEN
Yeast Wet Prep HPF POC: NONE SEEN

## 2019-01-11 LAB — HIV ANTIBODY (ROUTINE TESTING W REFLEX): HIV Screen 4th Generation wRfx: NONREACTIVE

## 2019-01-11 LAB — I-STAT BETA HCG BLOOD, ED (MC, WL, AP ONLY): I-stat hCG, quantitative: <5 m[IU]/mL (ref <5)

## 2019-01-11 LAB — LIPASE, BLOOD: Lipase: 29 U/L (ref 11–51)

## 2019-01-11 MED ORDER — SODIUM CHLORIDE 0.9% FLUSH: 3.0000 mL | Freq: Once | INTRAVENOUS | Status: DC

## 2019-01-11 MED ORDER — FLUCONAZOLE 150 MG PO TABS
150.0000 mg | ORAL_TABLET | Freq: Once | ORAL | Status: AC
  Administered 2019-01-11: 150 mg via ORAL
  Filled 2019-01-11: qty 1

## 2019-01-11 MED ORDER — METRONIDAZOLE 500 MG PO TABS
500.0000 mg | ORAL_TABLET | Freq: Once | ORAL | Status: AC
  Administered 2019-01-11: 500 mg via ORAL
  Filled 2019-01-11: qty 1

## 2019-01-11 MED ORDER — METRONIDAZOLE 500 MG PO TABS: 500.0000 mg | ORAL_TABLET | Freq: Two times a day (BID) | ORAL | 0 refills | Status: DC

## 2019-01-11 NOTE — ED Triage Notes (Signed)
Pt states she has been having abd cramping and light bleeding for 9 days. States her period should be due in 3 days and is usually very regular. This feels different. Last period was last month. Denies n/v.

## 2019-01-11 NOTE — ED Notes (Signed)
Patient transported to Ultrasound 

## 2019-01-11 NOTE — ED Provider Notes (Signed)
[facility]   CSN: [id number] Arrival date & time: 01/11/19  1715     History Chief Complaint  Patient presents with  . Abdominal Cramping    Hannah Haney is a 28 y.o. female.  HPI      Hannah SearingShanika Haney is a 28 y.o. female, with a history of asthma, presenting to the ED with abdominal discomfort for the past 9 days.  Discomfort is intermittent, cramping, moderate, suprapubic, nonradiating.  She was not experiencing any discomfort during my interview this evening. She also complains of intermittent vaginal spotting.  She has not had to use any protective products. Her menstrual cycles are typically regular with her last cycle starting November 21. Sexually active with one female partner.  Last bowel movement was earlier today. Denies fever/chills, chest pain, shortness of breath, cough, dysuria, N/V/D, other abnormal vaginal discharge, back/flank pain, or any other complaints.    Past Medical History:  Diagnosis Date  . Asthma     There are no problems to display for this patient.   Past Surgical History:  Procedure Laterality Date  . LIPOMA EXCISION       OB History   No obstetric history on file.     No family history on file.  Social History   Tobacco Use  . Smoking status: Current Every Day Smoker    Packs/day: 0.50  . Smokeless tobacco: Never Used  Substance Use Topics  . Alcohol use: Yes  . Drug use: No    Home Medications Prior to Admission medications   Medication Sig Start Date End Date Taking? Authorizing Provider  dicyclomine (BENTYL) 20 MG tablet Take 1 tablet (20 mg total) by mouth 2 (two) times daily. 06/15/18   Law, Waylan BogaAlexandra M, PA-C  metroNIDAZOLE (FLAGYL) 500 MG tablet Take 1 tablet (500 mg total) by mouth 2 (two) times daily. 01/11/19   Kirbi Farrugia C, PA-C  polyethylene glycol (MIRALAX) 17 g packet Take 17 g by mouth daily. 06/15/18   Emi HolesLaw, Alexandra M, PA-C    Allergies    Patient  has no known allergies.  Review of Systems   Review of Systems  Constitutional: Negative for chills and fever.  Respiratory: Negative for cough and shortness of breath.   Cardiovascular: Negative for chest pain.  Gastrointestinal: Positive for abdominal pain. Negative for blood in stool, diarrhea, nausea and vomiting.  Genitourinary: Positive for vaginal bleeding. Negative for dysuria, flank pain, hematuria and vaginal discharge.  Musculoskeletal: Negative for back pain.  Neurological: Negative for syncope and weakness.  All other systems reviewed and are negative.   Physical Exam Updated Vital Signs BP 123/70 (BP Location: Right Arm)   Pulse 79   Temp 98.3 F (36.8 C) (Oral)   Resp 18   LMP 12/19/2018   SpO2 98%   Physical Exam Vitals and nursing note reviewed.  Constitutional:      General: She is not in acute distress.    Appearance: She is well-developed. She is not diaphoretic.  HENT:     Head: Normocephalic and atraumatic.     Mouth/Throat:     Mouth: Mucous membranes are moist.     Pharynx: Oropharynx is clear.  Eyes:     Conjunctiva/sclera: Conjunctivae normal.  Cardiovascular:     Rate and Rhythm: Normal rate and regular rhythm.     Pulses: Normal pulses.          Radial pulses are 2+ on the right side and 2+ on the  left side.  Pulmonary:     Effort: Pulmonary effort is normal. No respiratory distress.  Abdominal:     Palpations: Abdomen is soft.     Tenderness: There is no abdominal tenderness. There is no guarding.  Genitourinary:    Comments: External genitalia normal Vagina with discharge - darker discharge, possibly old blood, scant amount Cervix  normal negative for cervical motion tenderness Adnexa palpated, no masses, negative for tenderness noted - patient inconsistently indicated there "might be some tenderness" in left adnexa, but also says "It might also be that I have to pee." Bladder palpated negative for tenderness Uterus palpated no masses,  negative for tenderness  No inguinal lymphadenopathy. Otherwise normal female genitalia. Med Tech served as chaperone during exam. Musculoskeletal:     Cervical back: Neck supple.     Right lower leg: No edema.     Left lower leg: No edema.  Lymphadenopathy:     Cervical: No cervical adenopathy.  Skin:    General: Skin is warm and dry.  Neurological:     Mental Status: She is alert.  Psychiatric:        Mood and Affect: Mood and affect normal.        Speech: Speech normal.        Behavior: Behavior normal.     ED Results / Procedures / Treatments   Labs (all labs ordered are listed, but only abnormal results are displayed) Labs Reviewed  WET PREP, GENITAL - Abnormal; Notable for the following components:      Result Value   Clue Cells Wet Prep HPF POC PRESENT (*)    WBC, Wet Prep HPF POC FEW (*)    All other components within normal limits  COMPREHENSIVE METABOLIC PANEL - Abnormal; Notable for the following components:   CO2 20 (*)    All other components within normal limits  CBC - Abnormal; Notable for the following components:   WBC 12.6 (*)    RBC 5.24 (*)    MCH 25.8 (*)    Platelets 434 (*)    All other components within normal limits  URINALYSIS, ROUTINE W REFLEX MICROSCOPIC - Abnormal; Notable for the following components:   Hgb urine dipstick MODERATE (*)    Bacteria, UA RARE (*)    All other components within normal limits  LIPASE, BLOOD  HIV ANTIBODY (ROUTINE TESTING W REFLEX)  RPR  I-STAT BETA HCG BLOOD, ED ([facility], [facility], [facility] ONLY)  GC/CHLAMYDIA PROBE AMP ([place]) NOT AT [facility]    EKG None  Radiology US Transvaginal Non-OB  Result Date: 01/11/2019 CLINICAL DATA:  Initial evaluation for acute left adnexal tenderness. EXAM: TRANSABDOMINAL AND TRANSVAGINAL ULTRASOUND OF PELVIS DOPPLER ULTRASOUND OF OVARIES TECHNIQUE: Both transabdominal and transvaginal ultrasound examinations of the pelvis were performed. Transabdominal technique was performed for global  imaging of the pelvis including uterus, ovaries, adnexal regions, and pelvic cul-de-sac. It was necessary to proceed with endovaginal exam following the transabdominal exam to visualize the uterus, endometrium, and ovaries. Color and duplex Doppler ultrasound was utilized to evaluate blood flow to the ovaries. COMPARISON:  Prior ultrasound from 06/15/2018. FINDINGS: Uterus Measurements: 7.7 x 3.8 x 4.6 cm = volume: 70.9 mL. No fibroids or other mass visualized. Endometrium Thickness: 4 mm.  No focal abnormality visualized. Right ovary Measurements: 2.2 x 0.9 x 1.2 cm = volume: 1.2 mL. Normal appearance/no adnexal mass. Left ovary Measurements: 2.9 x 1.8 x 1.8 cm = volume: 4.1 mL. Normal appearance/no adnexal mass. Pulsed Doppler evaluation of  both ovaries demonstrates normal low-resistance arterial and venous waveforms. Other findings Trace free fluid within the pelvis, presumably physiologic. IMPRESSION: Negative pelvic ultrasound. No evidence for ovarian torsion or other acute finding. Electronically Signed   By: Rise Mu M.D.   On: 01/11/2019 21:45   US Pelvis Complete  Result Date: 01/11/2019 CLINICAL DATA:  Initial evaluation for acute left adnexal tenderness. EXAM: TRANSABDOMINAL AND TRANSVAGINAL ULTRASOUND OF PELVIS DOPPLER ULTRASOUND OF OVARIES TECHNIQUE: Both transabdominal and transvaginal ultrasound examinations of the pelvis were performed. Transabdominal technique was performed for global imaging of the pelvis including uterus, ovaries, adnexal regions, and pelvic cul-de-sac. It was necessary to proceed with endovaginal exam following the transabdominal exam to visualize the uterus, endometrium, and ovaries. Color and duplex Doppler ultrasound was utilized to evaluate blood flow to the ovaries. COMPARISON:  Prior ultrasound from 06/15/2018. FINDINGS: Uterus Measurements: 7.7 x 3.8 x 4.6 cm = volume: 70.9 mL. No fibroids or other mass visualized. Endometrium Thickness: 4 mm.  No focal  abnormality visualized. Right ovary Measurements: 2.2 x 0.9 x 1.2 cm = volume: 1.2 mL. Normal appearance/no adnexal mass. Left ovary Measurements: 2.9 x 1.8 x 1.8 cm = volume: 4.1 mL. Normal appearance/no adnexal mass. Pulsed Doppler evaluation of both ovaries demonstrates normal low-resistance arterial and venous waveforms. Other findings Trace free fluid within the pelvis, presumably physiologic. IMPRESSION: Negative pelvic ultrasound. No evidence for ovarian torsion or other acute finding. Electronically Signed   By: Rise Mu M.D.   On: 01/11/2019 21:45   [place] Art/Ven Flow Abd Pelv Doppler  Result Date: 01/11/2019 CLINICAL DATA:  Initial evaluation for acute left adnexal tenderness. EXAM: TRANSABDOMINAL AND TRANSVAGINAL ULTRASOUND OF PELVIS DOPPLER ULTRASOUND OF OVARIES TECHNIQUE: Both transabdominal and transvaginal ultrasound examinations of the pelvis were performed. Transabdominal technique was performed for global imaging of the pelvis including uterus, ovaries, adnexal regions, and pelvic cul-de-sac. It was necessary to proceed with endovaginal exam following the transabdominal exam to visualize the uterus, endometrium, and ovaries. Color and duplex Doppler ultrasound was utilized to evaluate blood flow to the ovaries. COMPARISON:  Prior ultrasound from 06/15/2018. FINDINGS: Uterus Measurements: 7.7 x 3.8 x 4.6 cm = volume: 70.9 mL. No fibroids or other mass visualized. Endometrium Thickness: 4 mm.  No focal abnormality visualized. Right ovary Measurements: 2.2 x 0.9 x 1.2 cm = volume: 1.2 mL. Normal appearance/no adnexal mass. Left ovary Measurements: 2.9 x 1.8 x 1.8 cm = volume: 4.1 mL. Normal appearance/no adnexal mass. Pulsed Doppler evaluation of both ovaries demonstrates normal low-resistance arterial and venous waveforms. Other findings Trace free fluid within the pelvis, presumably physiologic. IMPRESSION: Negative pelvic ultrasound. No evidence for ovarian torsion or other acute  finding. Electronically Signed   By: Rise Mu M.D.   On: 01/11/2019 21:45    Procedures Procedures (including critical care time)  Medications Ordered in ED Medications  sodium chloride flush (NS) 0.9 % injection 3 mL (3 mLs Intravenous Not Given 01/11/19 2107)  metroNIDAZOLE (FLAGYL) tablet 500 mg (500 mg Oral Given 01/11/19 2235)  fluconazole (DIFLUCAN) tablet 150 mg (150 mg Oral Given 01/11/19 2235)    ED Course  I have reviewed the triage vital signs and the nursing notes.  Pertinent labs & imaging results that were available during my care of the patient were reviewed by me and considered in my medical decision making (see chart for details).    MDM Rules/Calculators/A&P  Patient presents with several days of abdominal cramping and vaginal spotting. Patient is nontoxic appearing, afebrile, not tachycardic, not tachypneic, not hypotensive, maintains excellent SPO2 on room air, and is in no apparent distress.  Mild leukocytosis nonspecific, especially considering the duration of the patient's symptoms. She has no tenderness on repeat abdominal exams. Clue cells on wet prep indicating possible BV. No acute abnormalities on ultrasound. OB/GYN follow-up. The patient was given instructions for home care as well as return precautions. Patient voices understanding of these instructions, accepts the plan, and is comfortable with discharge.  Of note, patient originally tells me her symptoms have been going on for about 9 days, however, when I went to deliver her results of possible BV, she states, "I just had that."  When I discussed this further with her, she then revealed she has had her intermittent abdominal cramping and spotting since just before she last saw her OB/GYN on November 16.  She states she was told at that time by her OB/GYN that her symptoms could be due to [place].  She had opted for the intravaginal treatment over the oral therapy.  Final  Clinical Impression(s) / ED Diagnoses Final diagnoses:  BV (bacterial vaginosis)    Rx / DC Orders ED Discharge Orders         Ordered    metroNIDAZOLE (FLAGYL) 500 MG tablet  2 times daily     01/11/19 2223           Layla Maw 01/11/19 2310    Davonna Belling, MD 01/17/19 [phone]

## 2019-01-11 NOTE — ED Notes (Signed)
Pt verbalized understanding of discharge instructions. Follow up care and prescriptions reviewed. Pt had no further questions at this time. Pt ambulated independently to lobby

## 2019-01-11 NOTE — Discharge Instructions (Addendum)
Evidence of bacterial vaginosis on wet prep. Please take all of your antibiotics until finished!   You may develop abdominal discomfort or diarrhea from the antibiotic.  You may help offset this with probiotics which you can buy or get in yogurt. Do not eat or take the probiotics until 2 hours after your antibiotic.   Follow-up with OB/GYN for any further management of this issue.

## 2019-01-12 LAB — GC/CHLAMYDIA PROBE AMP (~~LOC~~) NOT AT ARMC
Chlamydia: POSITIVE — AB
Neisseria Gonorrhea: NEGATIVE

## 2019-01-12 LAB — RPR
RPR Ser Ql: REACTIVE — AB
RPR Titer: 1:2 {titer}

## 2019-01-13 LAB — T.PALLIDUM AB, TOTAL: T Pallidum Abs: NONREACTIVE

## 2019-01-17 ENCOUNTER — Telehealth: Payer: Self-pay | Admitting: Student

## 2019-01-17 DIAGNOSIS — A749 Chlamydial infection, unspecified: Secondary | ICD-10-CM

## 2019-01-17 MED ORDER — AZITHROMYCIN 500 MG PO TABS: 1000.0000 mg | ORAL_TABLET | Freq: Once | ORAL | 0 refills | Status: AC

## 2019-01-17 NOTE — Telephone Encounter (Addendum)
Hannah Haney tested positive for  Chlamydia. Patient was called by RN and allergies and pharmacy confirmed. Rx sent to pharmacy of choice.   Jorje Guild, NP 01/17/2019 3:39 PM       ----- Message from Bjorn Loser, RN sent at 01/17/2019  3:31 PM EST ----- This patient tested positive for :  chlamydia  She "has NKDA", I have informed the patient of her results and confirmed her pharmacy is correct in her chart. Please send Rx.   Thank you,   Bjorn Loser, RN   Results faxed to [facility].

## 2019-05-19 ENCOUNTER — Encounter (HOSPITAL_COMMUNITY): Payer: Self-pay

## 2019-05-19 ENCOUNTER — Other Ambulatory Visit: Payer: Self-pay

## 2019-05-19 ENCOUNTER — Ambulatory Visit (HOSPITAL_COMMUNITY)
Admission: EM | Admit: 2019-05-19 | Discharge: 2019-05-19 | Disposition: A | Discharge status: Home / Self Care | Payer: 59 | Attending: Family Medicine | Admitting: Family Medicine

## 2019-05-19 DIAGNOSIS — H1012 Acute atopic conjunctivitis, left eye: Secondary | ICD-10-CM | POA: Diagnosis not present

## 2019-05-19 MED ORDER — OLOPATADINE HCL 0.2 % OP SOLN: 1.0000 [drp] | Freq: Once | OPHTHALMIC | 0 refills | Status: AC

## 2019-05-19 NOTE — ED Triage Notes (Signed)
Pt states woke up yesterday and left eye was pink, crusted and had drainage, itching.

## 2019-05-19 NOTE — Discharge Instructions (Signed)
Believe this is allergy related Try the allergy eye drops.  Follow up as needed for continued or worsening symptoms

## 2019-05-20 NOTE — ED Provider Notes (Signed)
[place]    CSN: [id number] Arrival date & time: 05/19/19  2355      History   Chief Complaint Chief Complaint  Patient presents with  . eye irritation    HPI Hannah Haney is a 29 y.o. female.   Patient is a 29 year old female who presents today with left eye irritation, crusting, drainage and itching.  Symptoms have been constant since yesterday.  Has not done anything to treat his symptoms.  Denies any trouble with vision, injury to the eye, fever, nasal congestion or rhinorrhea.  No foreign body in the eye.  ROS per HPI      Past Medical History:  Diagnosis Date  . Asthma     There are no problems to display for this patient.   Past Surgical History:  Procedure Laterality Date  . LIPOMA EXCISION      OB History   No obstetric history on file.      Home Medications    Prior to Admission medications   Medication Sig Start Date End Date Taking? Authorizing Provider  dicyclomine (BENTYL) 20 MG tablet Take 1 tablet (20 mg total) by mouth 2 (two) times daily. 06/15/18 05/19/19  Frederica Kuster, PA-C    Family History Family History  Problem Relation Age of Onset  . Diabetes Mother   . Cancer Father     Social History Social History   Tobacco Use  . Smoking status: Current Every Day Smoker    Packs/day: 0.50  . Smokeless tobacco: Never Used  Substance Use Topics  . Alcohol use: Yes    Alcohol/week: 7.0 standard drinks    Types: 7 Glasses of wine per week  . Drug use: No     Allergies   Patient has no known allergies.   Review of Systems Review of Systems   Physical Exam Triage Vital Signs ED Triage Vitals  Enc Vitals Group     BP 05/19/19 0946 102/61     Pulse Rate 05/19/19 0946 71     Resp 05/19/19 0946 16     Temp 05/19/19 0946 98.2 F (36.8 C)     Temp Source 05/19/19 0946 Oral     SpO2 05/19/19 0946 100 %     Weight 05/19/19 0947 280 lb (127 kg)     Height 05/19/19 0947 5\' 3"  (1.6 m)     Head Circumference  --      Peak Flow --      Pain Score 05/19/19 0947 8     Pain Loc --      Pain Edu? --      Excl. in [place]? --    No data found.  Updated Vital Signs BP 102/61   Pulse 71   Temp 98.2 F (36.8 C) (Oral)   Resp 16   Ht 5\' 3"  (1.6 m)   Wt 280 lb (127 kg)   SpO2 100%   BMI 49.60 kg/m   Visual Acuity Right Eye Distance:   Left Eye Distance:   Bilateral Distance:    Right Eye Near:   Left Eye Near:    Bilateral Near:     Physical Exam Vitals and nursing note reviewed.  Constitutional:      General: She is not in acute distress.    Appearance: Normal appearance. She is not ill-appearing, toxic-appearing or diaphoretic.  HENT:     Head: Normocephalic.     Nose: Nose normal.  Eyes:     General:  Right eye: No discharge.        Left eye: No discharge.     Extraocular Movements: Extraocular movements intact.     Conjunctiva/sclera: Conjunctivae normal.     Pupils: Pupils are equal, round, and reactive to light.     Comments: Eye exam normal No obvious redness, swelling, drainage.  Non tender.   Pulmonary:     Effort: Pulmonary effort is normal.  Musculoskeletal:        General: Normal range of motion.     Cervical back: Normal range of motion.  Skin:    General: Skin is warm and dry.     Findings: No rash.  Neurological:     Mental Status: She is alert.  Psychiatric:        Mood and Affect: Mood normal.      UC Treatments / Results  Labs (all labs ordered are listed, but only abnormal results are displayed) Labs Reviewed - No data to display  EKG   Radiology No results found.  Procedures Procedures (including critical care time)  Medications Ordered in UC Medications - No data to display  Initial Impression / Assessment and Plan / UC Course  I have reviewed the triage vital signs and the nursing notes.  Pertinent labs & imaging results that were available during my care of the patient were reviewed by me and considered in my medical decision  making (see chart for details).     Allergic conjunctivitis likely diagnosis based on symptoms.  We will try allergy eyedrops.  Do not believe her eye to be infected at this time.  Exam completely benign Follow up as needed for continued or worsening symptoms  Final Clinical Impressions(s) / UC Diagnoses   Final diagnoses:  Allergic conjunctivitis of left eye     Discharge Instructions     Believe this is allergy related Try the allergy eye drops.  Follow up as needed for continued or worsening symptoms    ED Prescriptions    Medication Sig Dispense Auth. Provider   Olopatadine HCl 0.2 % SOLN Apply 1 drop to eye once for 1 dose. 2.5 mL Dahlia Byes A, NP     PDMP not reviewed this encounter.   Janace Aris, NP 05/20/19 [phone]

## 2019-06-30 ENCOUNTER — Ambulatory Visit (HOSPITAL_COMMUNITY)
Admission: EM | Admit: 2019-06-30 | Discharge: 2019-06-30 | Disposition: A | Discharge status: Home / Self Care | Payer: 59 | Attending: Family Medicine | Admitting: Family Medicine

## 2019-06-30 ENCOUNTER — Other Ambulatory Visit: Payer: Self-pay

## 2019-06-30 ENCOUNTER — Encounter (HOSPITAL_COMMUNITY): Payer: Self-pay

## 2019-06-30 DIAGNOSIS — Z833 Family history of diabetes mellitus: Secondary | ICD-10-CM | POA: Diagnosis not present

## 2019-06-30 DIAGNOSIS — R0981 Nasal congestion: Secondary | ICD-10-CM | POA: Diagnosis present

## 2019-06-30 DIAGNOSIS — L258 Unspecified contact dermatitis due to other agents: Secondary | ICD-10-CM | POA: Diagnosis not present

## 2019-06-30 DIAGNOSIS — Z20822 Contact with and (suspected) exposure to covid-19: Secondary | ICD-10-CM | POA: Insufficient documentation

## 2019-06-30 DIAGNOSIS — J069 Acute upper respiratory infection, unspecified: Secondary | ICD-10-CM | POA: Diagnosis not present

## 2019-06-30 DIAGNOSIS — F17210 Nicotine dependence, cigarettes, uncomplicated: Secondary | ICD-10-CM | POA: Insufficient documentation

## 2019-06-30 DIAGNOSIS — R21 Rash and other nonspecific skin eruption: Secondary | ICD-10-CM | POA: Insufficient documentation

## 2019-06-30 MED ORDER — TRIAMCINOLONE ACETONIDE 0.1 % EX CREA: 1.0000 "application " | TOPICAL_CREAM | Freq: Two times a day (BID) | CUTANEOUS | 0 refills | Status: DC

## 2019-06-30 NOTE — Discharge Instructions (Addendum)
Your COVID test is pending.  You should self quarantine until the test result is back.    Take Tylenol as needed for fever or discomfort.  Rest and keep yourself hydrated.    Go to the emergency department if you develop shortness of breath, severe diarrhea, high fever not relieved by Tylenol or ibuprofen, or other concerning symptoms.    I have sent in some triamcinolone cream for you to apply to the rash

## 2019-06-30 NOTE — ED Provider Notes (Signed)
[place]   [id number] 06/30/19 Arrival Time: 2992   CC: COVID symptoms  SUBJECTIVE: History from: patient.  Hannah Haney is a 29 y.o. female who presents with abrupt onset of nasal congestion, PND, and persistent dry cough for 3 days. Also reports rash to her chest where her bra covers. Reports that the rash is red and itchy. Has not tried OTC medications for this. Denies new exposures including laundry detergent, soap, lotion, perfume. Denies sick exposure to COVID, flu or strep. Denies recent travel. Has tried mucinex with relief. There are no aggravating symptoms. Denies previous symptoms in the past. Denies fever, chills, fatigue, sinus pain, rhinorrhea, sore throat, SOB, wheezing, chest pain, nausea, changes in bowel or bladder habits.    ROS: As per HPI.  All other pertinent ROS negative.     Past Medical History:  Diagnosis Date  . Asthma    Past Surgical History:  Procedure Laterality Date  . LIPOMA EXCISION     No Known Allergies No current facility-administered medications on file prior to encounter.   Current Outpatient Medications on File Prior to Encounter  Medication Sig Dispense Refill  . [DISCONTINUED] dicyclomine (BENTYL) 20 MG tablet Take 1 tablet (20 mg total) by mouth 2 (two) times daily. 20 tablet 0   Social History   Socioeconomic History  . Marital status: Single    Spouse name: Not on file  . Number of children: Not on file  . Years of education: Not on file  . Highest education level: Not on file  Occupational History  . Not on file  Tobacco Use  . Smoking status: Current Every Day Smoker    Packs/day: 0.50  . Smokeless tobacco: Never Used  Substance and Sexual Activity  . Alcohol use: Yes    Alcohol/week: 7.0 standard drinks    Types: 7 Glasses of wine per week  . Drug use: No  . Sexual activity: Not on file  Other Topics Concern  . Not on file  Social History Narrative  . Not on file   Social Determinants of Health    Financial Resource Strain:   . Difficulty of Paying Living Expenses:   Food Insecurity:   . Worried About Charity fundraiser in the Last Year:   . Arboriculturist in the Last Year:   Transportation Needs:   . Film/video editor (Medical):   Marland Kitchen Lack of Transportation (Non-Medical):   Physical Activity:   . Days of Exercise per Week:   . Minutes of Exercise per Session:   Stress:   . Feeling of Stress :   Social Connections:   . Frequency of Communication with Friends and Family:   . Frequency of Social Gatherings with Friends and Family:   . Attends Religious Services:   . Active Member of Clubs or Organizations:   . Attends Archivist Meetings:   Marland Kitchen Marital Status:   Intimate Partner Violence:   . Fear of Current or Ex-Partner:   . Emotionally Abused:   Marland Kitchen Physically Abused:   . Sexually Abused:    Family History  Problem Relation Age of Onset  . Diabetes Mother   . Cancer Father     OBJECTIVE:  Vitals:   06/30/19 1449  BP: 112/84  Pulse: 72  Resp: 18  Temp: 98.4 F (36.9 C)  TempSrc: Oral  SpO2: 100%  Weight: 260 lb (117.9 kg)  Height: 5\' 3"  (1.6 m)     General appearance: alert; appears  fatigued, but nontoxic; speaking in full sentences and tolerating own secretions HEENT: NCAT; Ears: EACs clear, TMs pearly gray; Eyes: PERRL.  EOM grossly intact. Sinuses: nontender; Nose: nares patent without rhinorrhea, Throat: oropharynx clear, tonsils non erythematous or enlarged, uvula midline  Neck: supple without LAD Lungs: unlabored respirations, symmetrical air entry; cough: mild; no respiratory distress; CTAB Heart: regular rate and rhythm.  Radial pulses 2+ symmetrical bilaterally Skin: warm and dry, erythematous 0.5cm in diameter flat macular rash to breasts under bra coverage Psychological: alert and cooperative; normal mood and affect  LABS:  No results found for this or any previous visit (from the past 24 hour(s)).   ASSESSMENT & PLAN:  1.  Viral URI with cough   2. Rash and nonspecific skin eruption   3. Contact dermatitis due to other agent, unspecified contact dermatitis type     Meds ordered this encounter  Medications  . triamcinolone cream (KENALOG) 0.1 %    Sig: Apply 1 application topically 2 (two) times daily.    Dispense:  30 g    Refill:  0    Order Specific Question:   Supervising Provider    Answer:   Merrilee Jansky [[id number]]    Rash Contact Dermatitis Prescribed triamcinolone Follow up if not improving  Cough Continue Mucinex with benefit COVID testing ordered.  It will take between 1-2 days for test results.  Someone will contact you regarding abnormal results.    Patient should remain in quarantine until they have received Covid results.  If negative you may resume normal activities (go back to work/school) while practicing hand hygiene, social distance, and mask wearing.  If positive, patient should remain in quarantine for 10 days from symptom onset AND greater than 72 hours after symptoms resolution (absence of fever without the use of fever-reducing medication and improvement in respiratory symptoms), whichever is longer Get plenty of rest and push fluids Use OTC zyrtec for nasal congestion, runny nose, and/or sore throat Use OTC flonase for nasal congestion and runny nose Use medications daily for symptom relief Use OTC medications like ibuprofen or tylenol as needed fever or pain Call or go to the ED if you have any new or worsening symptoms such as fever, worsening cough, shortness of breath, chest tightness, chest pain, turning blue, changes in mental status.  Reviewed expectations re: course of current medical issues. Questions answered. Outlined signs and symptoms indicating need for more acute intervention. Patient verbalized understanding. After Visit Summary given.         Moshe Cipro, NP 06/30/19 1529

## 2019-06-30 NOTE — ED Triage Notes (Signed)
Pt c/o productive cough w/clear//yellow mucous, chest and nasal congestionx3 days. Pt denies SOB. Pt has non labored breathing. Pt c/o rash on breastsx1 wk. Pt states it itches.

## 2019-07-01 LAB — SARS CORONAVIRUS 2 (TAT 6-24 HRS): SARS Coronavirus 2: NEGATIVE

## 2019-08-14 ENCOUNTER — Encounter (HOSPITAL_COMMUNITY): Payer: Self-pay | Admitting: *Deleted

## 2019-08-14 ENCOUNTER — Emergency Department (HOSPITAL_COMMUNITY)
Admission: EM | Admit: 2019-08-14 | Discharge: 2019-08-14 | Disposition: A | Discharge status: Home / Self Care | Payer: Self-pay | Attending: Emergency Medicine | Admitting: Emergency Medicine

## 2019-08-14 ENCOUNTER — Other Ambulatory Visit: Payer: Self-pay

## 2019-08-14 DIAGNOSIS — R109 Unspecified abdominal pain: Secondary | ICD-10-CM | POA: Insufficient documentation

## 2019-08-14 DIAGNOSIS — Z5321 Procedure and treatment not carried out due to patient leaving prior to being seen by health care provider: Secondary | ICD-10-CM | POA: Insufficient documentation

## 2019-08-14 LAB — CBC
HCT: 44.6 % (ref 36.0–46.0)
Hemoglobin: 14 g/dL (ref 12.0–15.0)
MCH: 26.1 pg (ref 26.0–34.0)
MCHC: 31.4 g/dL (ref 30.0–36.0)
MCV: 83.2 fL (ref 80.0–100.0)
Platelets: 502 10*3/uL — ABNORMAL HIGH (ref 150–400)
RBC: 5.36 MIL/uL — ABNORMAL HIGH (ref 3.87–5.11)
RDW: 14.2 % (ref 11.5–15.5)
WBC: 18.7 10*3/uL — ABNORMAL HIGH (ref 4.0–10.5)
nRBC: 0 % (ref 0.0–0.2)

## 2019-08-14 LAB — COMPREHENSIVE METABOLIC PANEL
ALT: 23 U/L (ref 0–44)
AST: 18 U/L (ref 15–41)
Albumin: 3.6 g/dL (ref 3.5–5.0)
Alkaline Phosphatase: 82 U/L (ref 38–126)
Anion gap: 11 (ref 5–15)
BUN: 12 mg/dL (ref 6–20)
CO2: 22 mmol/L (ref 22–32)
Calcium: 9 mg/dL (ref 8.9–10.3)
Chloride: 104 mmol/L (ref 98–111)
Creatinine, Ser: 0.7 mg/dL (ref 0.44–1.00)
GFR calc Af Amer: >60 mL/min (ref >60)
GFR calc non Af Amer: >60 mL/min (ref >60)
Glucose, Bld: 101 mg/dL — ABNORMAL HIGH (ref 70–99)
Potassium: 3.6 mmol/L (ref 3.5–5.1)
Sodium: 137 mmol/L (ref 135–145)
Total Bilirubin: 0.4 mg/dL (ref 0.3–1.2)
Total Protein: 7.4 g/dL (ref 6.5–8.1)

## 2019-08-14 LAB — I-STAT BETA HCG BLOOD, ED (MC, WL, AP ONLY): I-stat hCG, quantitative: <5 m[IU]/mL (ref <5)

## 2019-08-14 LAB — URINALYSIS, ROUTINE W REFLEX MICROSCOPIC
Bilirubin Urine: NEGATIVE
Glucose, UA: NEGATIVE mg/dL
Ketones, ur: NEGATIVE mg/dL
Nitrite: POSITIVE — AB
Protein, ur: 30 mg/dL — AB
RBC / HPF: >50 RBC/hpf — ABNORMAL HIGH (ref 0–5)
Specific Gravity, Urine: 1.018 (ref 1.005–1.030)
pH: 7 (ref 5.0–8.0)

## 2019-08-14 LAB — LIPASE, BLOOD: Lipase: 27 U/L (ref 11–51)

## 2019-08-14 MED ORDER — SODIUM CHLORIDE 0.9% FLUSH: 3.0000 mL | Freq: Once | INTRAVENOUS | Status: DC

## 2019-08-14 NOTE — ED Triage Notes (Signed)
The pt is c/o abd pain for 2 weeks  No nausea vomiting or diarrhea  Last bm this am normal

## 2019-08-14 NOTE — ED Notes (Signed)
Pt called with no response 

## 2019-08-16 ENCOUNTER — Emergency Department (HOSPITAL_BASED_OUTPATIENT_CLINIC_OR_DEPARTMENT_OTHER): Payer: Self-pay

## 2019-08-16 ENCOUNTER — Encounter (HOSPITAL_BASED_OUTPATIENT_CLINIC_OR_DEPARTMENT_OTHER): Payer: Self-pay

## 2019-08-16 ENCOUNTER — Emergency Department (HOSPITAL_BASED_OUTPATIENT_CLINIC_OR_DEPARTMENT_OTHER)
Admission: EM | Admit: 2019-08-16 | Discharge: 2019-08-16 | Disposition: A | Discharge status: Home / Self Care | Payer: Self-pay | Attending: Emergency Medicine | Admitting: Emergency Medicine

## 2019-08-16 ENCOUNTER — Other Ambulatory Visit: Payer: Self-pay

## 2019-08-16 DIAGNOSIS — J45909 Unspecified asthma, uncomplicated: Secondary | ICD-10-CM | POA: Insufficient documentation

## 2019-08-16 DIAGNOSIS — F17200 Nicotine dependence, unspecified, uncomplicated: Secondary | ICD-10-CM | POA: Insufficient documentation

## 2019-08-16 DIAGNOSIS — N83202 Unspecified ovarian cyst, left side: Secondary | ICD-10-CM

## 2019-08-16 DIAGNOSIS — N390 Urinary tract infection, site not specified: Secondary | ICD-10-CM | POA: Insufficient documentation

## 2019-08-16 DIAGNOSIS — N8302 Follicular cyst of left ovary: Secondary | ICD-10-CM | POA: Insufficient documentation

## 2019-08-16 LAB — URINALYSIS, MICROSCOPIC (REFLEX): WBC, UA: >50 WBC/hpf (ref 0–5)

## 2019-08-16 LAB — WET PREP, GENITAL
Sperm: NONE SEEN
Trich, Wet Prep: NONE SEEN
Yeast Wet Prep HPF POC: NONE SEEN

## 2019-08-16 LAB — URINALYSIS, ROUTINE W REFLEX MICROSCOPIC
Bilirubin Urine: NEGATIVE
Glucose, UA: NEGATIVE mg/dL
Ketones, ur: NEGATIVE mg/dL
Nitrite: NEGATIVE
Protein, ur: NEGATIVE mg/dL
Specific Gravity, Urine: 1.02 (ref 1.005–1.030)
pH: 6.5 (ref 5.0–8.0)

## 2019-08-16 LAB — PREGNANCY, URINE: Preg Test, Ur: NEGATIVE

## 2019-08-16 MED ORDER — NITROFURANTOIN MONOHYD MACRO 100 MG PO CAPS: 100.0000 mg | ORAL_CAPSULE | Freq: Two times a day (BID) | ORAL | 0 refills | Status: AC

## 2019-08-16 MED ORDER — IBUPROFEN 200 MG PO TABS: 200.0000 mg | ORAL_TABLET | Freq: Four times a day (QID) | ORAL | 0 refills | Status: DC | PRN

## 2019-08-16 NOTE — ED Triage Notes (Addendum)
Pt c/o lower abd pain/lower back pain x 3 weeks-denies n/v/d and vaginal d/c-states she feels she has a UTI-states "just a weird feeling in my bladder"-NAD-steady gait

## 2019-08-16 NOTE — ED Provider Notes (Signed)
[facility] EMERGENCY DEPARTMENT Provider Note   CSN: [id number] Arrival date & time: 08/16/19  1436     History Chief Complaint  Patient presents with  . Abdominal Pain    Hannah Haney is 29yo F w/ no significant medical history who presents with 3 week history of abdominal pain and back pain. Patient reports pain is across lower abdomen/lower back, only occurs at night, and worsens with standing. She describes pain as "someone doing back flips." Reports increased urgency but denies incontinence, dysuria. Denies fever, n/v/d. She reports previous diagnosis of Chlamydia this past December with completion of antibiotics. Currently sexually active, no new partners. Denies vaginal d/c. Currently on period, says it is a few days later than normal w/ regular flow.             Past Medical History:  Diagnosis Date  . Asthma     There are no problems to display for this patient.   Past Surgical History:  Procedure Laterality Date  . LIPOMA EXCISION       OB History   No obstetric history on file.     Family History  Problem Relation Age of Onset  . Diabetes Mother   . Cancer Father     Social History   Tobacco Use  . Smoking status: Current Every Day Smoker    Packs/day: 0.50  . Smokeless tobacco: Never Used  Vaping Use  . Vaping Use: Never used  Substance Use Topics  . Alcohol use: Yes    Comment: occ  . Drug use: No    Home Medications Prior to Admission medications   Medication Sig Start Date End Date Taking? Authorizing Provider  triamcinolone cream (KENALOG) 0.1 % Apply 1 application topically 2 (two) times daily. 06/30/19   Moshe Cipro, NP  dicyclomine (BENTYL) 20 MG tablet Take 1 tablet (20 mg total) by mouth 2 (two) times daily. 06/15/18 05/19/19  Emi Holes, PA-C    Allergies    Patient has no known allergies.  Review of Systems   Review of Systems  Physical Exam Updated Vital Signs BP 128/78 (BP Location: Right Arm)    Pulse 87   Temp 98.6 F (37 C) (Oral)   Resp 19   Ht 5\' 3"  (1.6 m)   Wt 109.9 kg   LMP 08/13/2019   SpO2 99%   BMI 42.90 kg/m   Physical Exam Constitutional:      General: She is awake. She is not in acute distress.    Appearance: Normal appearance. She is not ill-appearing.  HENT:     Head: Normocephalic and atraumatic.  Eyes:     General: Lids are normal.     Conjunctiva/sclera: Conjunctivae normal.  Cardiovascular:     Rate and Rhythm: Normal rate and regular rhythm.     Pulses: Normal pulses.     Heart sounds: Normal heart sounds.  Pulmonary:     Effort: Pulmonary effort is normal.     Breath sounds: Normal breath sounds.  Abdominal:     General: Bowel sounds are normal. There is no distension.     Palpations: Abdomen is soft.     Tenderness: There is no abdominal tenderness. There is no right CVA tenderness or left CVA tenderness.  Musculoskeletal:     Right lower leg: No edema.     Left lower leg: No edema.     Comments: Mild pain to palpation on lower back bilaterally.  Skin:    General: Skin is  warm.     Capillary Refill: Capillary refill takes less than 2 seconds.  Neurological:     General: No focal deficit present.     Mental Status: She is alert and oriented to person, place, and time.  Psychiatric:        Speech: Speech normal.        Behavior: Behavior normal. Behavior is cooperative.        Cognition and Memory: Cognition normal.     ED Results / Procedures / Treatments   Labs (all labs ordered are listed, but only abnormal results are displayed) Labs Reviewed  WET PREP, GENITAL - Abnormal; Notable for the following components:      Result Value   Clue Cells Wet Prep HPF POC PRESENT (*)    WBC, Wet Prep HPF POC MODERATE (*)    All other components within normal limits  URINALYSIS, ROUTINE W REFLEX MICROSCOPIC - Abnormal; Notable for the following components:   APPearance HAZY (*)    Hgb urine dipstick LARGE (*)    Leukocytes,Ua MODERATE (*)      All other components within normal limits  URINALYSIS, MICROSCOPIC (REFLEX) - Abnormal; Notable for the following components:   Bacteria, UA MANY (*)    All other components within normal limits  URINE CULTURE  PREGNANCY, URINE  GC/CHLAMYDIA PROBE AMP ([place]) NOT AT [facility]    EKG None  Radiology US Pelvis Complete  Result Date: 08/16/2019 CLINICAL DATA:  Intermittent lower abdominal pain x3 weeks EXAM: TRANSABDOMINAL ULTRASOUND OF PELVIS TECHNIQUE: Transabdominal ultrasound examination of the pelvis was performed including evaluation of the uterus, ovaries, adnexal regions, and pelvic cul-de-sac. COMPARISON:  01/11/2019 FINDINGS: Uterus Measurements: 7.8 x 4.1 x 4.7 cm = volume: 77 mL. No fibroids or other mass visualized. Endometrium Thickness: 2 mm.  No focal abnormality visualized. Right ovary Measurements: 1.7 x 0.8 x 1.4 cm = volume: 2 mL. Normal appearance/no adnexal mass. Left ovary Measurements: 6.2 x 3.9 x 3.8 cm = volume: 48 mL. The left ovary contains a simple cyst measuring 4.2 x 2.9 x 3.6 cm, possibly representing a follicular cyst in a patient of this age. The left ovary demonstrates normal vascularity on color Doppler imaging. Other findings:  No abnormal free fluid. IMPRESSION: Interval development of a a 4.2 cm unilocular simple cyst within the left ovary possibly representing a follicular cyst in a female patient of this age. This has benign characteristics and is a common finding in premenopausal females. No imaging follow up is required. This follows consensus guidelines: Simple Adnexal Cysts: SRU Consensus Conference Update on Follow-up and Reporting. Radiology 2019; [id number]. Electronically Signed   By: Helyn Numbers MD   On: 08/16/2019 17:24    Procedures Procedures (including critical care time)  Medications Ordered in ED Medications - No data to display  ED Course  I have reviewed the triage vital signs and the nursing notes.  Pertinent labs & imaging  results that were available during my care of the patient were reviewed by me and considered in my medical decision making (see chart for details).    MDM Rules/Calculators/A&P  Patient is 28yo F w/o significant medical history who presents w/ three week history of lower abdominal and back pain that occurs at night. Sexually active, no new partners. Diagnosed w/ Chlamydia in December, reports medication compliance. On exam afebrile, no abdominal tenderness. Differential UTI v STI. Pending pelvic exam, UA, STI panel.  Pelvic exam benign, no discharge. Wet mount showed clue cells but  symptoms not consistent with BV. GC unremarkable. UA consistent w/ UTI. Abdominal u/s showed simple L ovary cyst.  Patient to be discharged on abx for UTI and Motrin for pain control. Discussed plan and ultrasound findings with patient. She will follow-up with PCP if symptoms persist and regular OBGYN on next appointment.                           Final Clinical Impression(s) / ED Diagnoses Final diagnoses:  Lower urinary tract infectious disease  Cyst of left ovary   Rx / DC Orders ED Discharge Orders         Ordered    ibuprofen (MOTRIN IB) 200 MG tablet  Every 6 hours PRN     Discontinue  Reprint     08/16/19 1824    nitrofurantoin, macrocrystal-monohydrate, (MACROBID) 100 MG capsule  2 times daily     Discontinue  Reprint     08/16/19 1833           Evlyn Kanner, MD 08/16/19 [place]    Tilden Fossa, MD 08/17/19 1504

## 2019-08-16 NOTE — Discharge Instructions (Addendum)
-  Can take Motrin every 6 hours as needed for pain. -Left ovarian cyst appears benign and does not need further imaging. Follow-up with OB/GYN. -If symptoms persist after completion of antibiotics, follow-up with primary care physician.

## 2019-08-17 LAB — GC/CHLAMYDIA PROBE AMP (~~LOC~~) NOT AT ARMC
Chlamydia: NEGATIVE
Comment: NEGATIVE
Comment: NORMAL
Neisseria Gonorrhea: NEGATIVE

## 2019-08-18 LAB — URINE CULTURE: Culture: >=100000 — AB

## 2019-08-20 ENCOUNTER — Telehealth: Payer: Self-pay | Admitting: Emergency Medicine

## 2019-08-20 NOTE — Telephone Encounter (Signed)
Post ED Visit - Positive Culture Follow-up  Culture report reviewed by antimicrobial stewardship pharmacist: [facility] []  , Pharm.D. []  Enzo Bi, Pharm.D., BCPS AQ-ID []  , Pharm.D., BCPS []  Celedonio Miyamoto, .D., BCPS []  [name], .D., BCPS, AAHIVP []  Georgina Pillion, Pharm.D., BCPS, AAHIVP []  [address], PharmD, BCPS []  , PharmD, BCPS []  [place], PharmD, BCPS [x]  [address], PharmD []  , PharmD, BCPS []  Estella Husk, PharmD  Pharmacy Team []  Lysle Pearl, PharmD []  , PharmD []  Phillips Climes, PharmD []  , Rph []  Agapito Games) , PharmD []  Leia Alf, PharmD []  , PharmD []  Mervyn Gay, PharmD []  , PharmD []  Vinnie Level, PharmD []  [facility], PharmD []  , PharmD []  Len Childs, PharmD   Positive urine culture Treated with Nitrofurantoin, organism sensitive to the same and no further patient follow-up is required at this time.  Hannah Haney 08/20/2019, 10:31 AM

## 2020-02-03 ENCOUNTER — Other Ambulatory Visit: Payer: Self-pay

## 2020-02-03 ENCOUNTER — Emergency Department (HOSPITAL_COMMUNITY)
Admission: EM | Admit: 2020-02-03 | Discharge: 2020-02-04 | Disposition: A | Discharge status: Home / Self Care | Payer: Self-pay | Attending: Emergency Medicine | Admitting: Emergency Medicine

## 2020-02-03 DIAGNOSIS — Z5321 Procedure and treatment not carried out due to patient leaving prior to being seen by health care provider: Secondary | ICD-10-CM | POA: Insufficient documentation

## 2020-02-03 DIAGNOSIS — Z202 Contact with and (suspected) exposure to infections with a predominantly sexual mode of transmission: Secondary | ICD-10-CM | POA: Insufficient documentation

## 2020-02-03 NOTE — ED Triage Notes (Addendum)
Pt states she received text from boyfriend today stating that he had chlamydia. Would like to get STD testing. Denies sx, states she feels she might have UTI as well

## 2020-02-04 LAB — I-STAT BETA HCG BLOOD, ED (MC, WL, AP ONLY): I-stat hCG, quantitative: <5 m[IU]/mL (ref <5)

## 2020-02-04 LAB — URINALYSIS, ROUTINE W REFLEX MICROSCOPIC
Bilirubin Urine: NEGATIVE
Glucose, UA: NEGATIVE mg/dL
Ketones, ur: NEGATIVE mg/dL
Leukocytes,Ua: NEGATIVE
Nitrite: POSITIVE — AB
Protein, ur: NEGATIVE mg/dL
Specific Gravity, Urine: 1.025 (ref 1.005–1.030)
pH: 5 (ref 5.0–8.0)

## 2020-02-04 NOTE — ED Notes (Signed)
Pt called x 3  No answer. 

## 2020-02-06 ENCOUNTER — Emergency Department (HOSPITAL_COMMUNITY)
Admission: EM | Admit: 2020-02-06 | Discharge: 2020-02-06 | Disposition: A | Discharge status: Home / Self Care | Payer: Self-pay | Attending: Emergency Medicine | Admitting: Emergency Medicine

## 2020-02-06 ENCOUNTER — Encounter (HOSPITAL_COMMUNITY): Payer: Self-pay

## 2020-02-06 ENCOUNTER — Other Ambulatory Visit: Payer: Self-pay

## 2020-02-06 DIAGNOSIS — Z5321 Procedure and treatment not carried out due to patient leaving prior to being seen by health care provider: Secondary | ICD-10-CM | POA: Insufficient documentation

## 2020-02-06 DIAGNOSIS — A749 Chlamydial infection, unspecified: Secondary | ICD-10-CM | POA: Insufficient documentation

## 2020-02-06 DIAGNOSIS — R202 Paresthesia of skin: Secondary | ICD-10-CM | POA: Insufficient documentation

## 2020-02-06 LAB — URINALYSIS, ROUTINE W REFLEX MICROSCOPIC
Bilirubin Urine: NEGATIVE
Glucose, UA: NEGATIVE mg/dL
Hgb urine dipstick: NEGATIVE
Ketones, ur: NEGATIVE mg/dL
Nitrite: POSITIVE — AB
Protein, ur: NEGATIVE mg/dL
Specific Gravity, Urine: 1.019 (ref 1.005–1.030)
pH: 7 (ref 5.0–8.0)

## 2020-02-06 NOTE — ED Notes (Signed)
Pt called multiple times, no answer 

## 2020-02-06 NOTE — ED Triage Notes (Addendum)
Pt requesting an STD screening. Pt reports her boyfriend called her to inform her he had tested positive for chlamydia. Pt was here Friday and left d/t wait times. Then had to work all weekend, returned today requesting an exam   Pt denies any vaginal odor or discharge. Does endorse a tingling when urinating

## 2020-02-07 ENCOUNTER — Other Ambulatory Visit: Payer: Self-pay

## 2020-02-07 ENCOUNTER — Emergency Department (HOSPITAL_BASED_OUTPATIENT_CLINIC_OR_DEPARTMENT_OTHER)
Admission: EM | Admit: 2020-02-07 | Discharge: 2020-02-07 | Disposition: A | Discharge status: Home / Self Care | Payer: Self-pay | Attending: Emergency Medicine | Admitting: Emergency Medicine

## 2020-02-07 ENCOUNTER — Encounter (HOSPITAL_BASED_OUTPATIENT_CLINIC_OR_DEPARTMENT_OTHER): Payer: Self-pay | Admitting: *Deleted

## 2020-02-07 DIAGNOSIS — R202 Paresthesia of skin: Secondary | ICD-10-CM | POA: Insufficient documentation

## 2020-02-07 DIAGNOSIS — J45909 Unspecified asthma, uncomplicated: Secondary | ICD-10-CM | POA: Insufficient documentation

## 2020-02-07 DIAGNOSIS — F17200 Nicotine dependence, unspecified, uncomplicated: Secondary | ICD-10-CM | POA: Insufficient documentation

## 2020-02-07 DIAGNOSIS — Z202 Contact with and (suspected) exposure to infections with a predominantly sexual mode of transmission: Secondary | ICD-10-CM | POA: Insufficient documentation

## 2020-02-07 DIAGNOSIS — R112 Nausea with vomiting, unspecified: Secondary | ICD-10-CM | POA: Insufficient documentation

## 2020-02-07 DIAGNOSIS — R509 Fever, unspecified: Secondary | ICD-10-CM | POA: Insufficient documentation

## 2020-02-07 DIAGNOSIS — R109 Unspecified abdominal pain: Secondary | ICD-10-CM | POA: Insufficient documentation

## 2020-02-07 LAB — URINALYSIS, MICROSCOPIC (REFLEX)

## 2020-02-07 LAB — WET PREP, GENITAL
Sperm: NONE SEEN
Trich, Wet Prep: NONE SEEN
WBC, Wet Prep HPF POC: NONE SEEN
Yeast Wet Prep HPF POC: NONE SEEN

## 2020-02-07 LAB — URINALYSIS, ROUTINE W REFLEX MICROSCOPIC
Bilirubin Urine: NEGATIVE
Glucose, UA: NEGATIVE mg/dL
Ketones, ur: NEGATIVE mg/dL
Nitrite: NEGATIVE
Protein, ur: NEGATIVE mg/dL
Specific Gravity, Urine: 1.025 (ref 1.005–1.030)
pH: 6 (ref 5.0–8.0)

## 2020-02-07 MED ORDER — CEFTRIAXONE SODIUM 500 MG IJ SOLR
500.0000 mg | Freq: Once | INTRAMUSCULAR | Status: AC
  Administered 2020-02-07: 500 mg via INTRAMUSCULAR
  Filled 2020-02-07: qty 500

## 2020-02-07 MED ORDER — DOXYCYCLINE HYCLATE 100 MG PO TABS
100.0000 mg | ORAL_TABLET | Freq: Once | ORAL | Status: AC
  Administered 2020-02-07: 100 mg via ORAL
  Filled 2020-02-07: qty 1

## 2020-02-07 MED ORDER — DOXYCYCLINE HYCLATE 100 MG PO CAPS
100.0000 mg | ORAL_CAPSULE | Freq: Two times a day (BID) | ORAL | 0 refills | Status: DC
Start: 2020-02-07 — End: 2022-01-17

## 2020-02-07 NOTE — ED Notes (Signed)
Pt complains of tingling with urination. Pt boyfriend tested positive for chlamydia. Pt has no vaginal itching or discharge

## 2020-02-07 NOTE — ED Triage Notes (Signed)
Possible STD. Asymptomatic.

## 2020-02-07 NOTE — ED Provider Notes (Signed)
[facility] EMERGENCY DEPARTMENT Provider Note   CSN: [id number] Arrival date & time: 02/07/20  1329     History Chief Complaint  Patient presents with  . Exposure to STD    Hannah Haney is a 30 y.o. female w PMHx asthma, presenting to the emergency department complaint of exposure to chlamydia.  She states her ex-boyfriend called her and informed her he tested positive for chlamydia.  She is unsure when the exposure was though she was sexually active with him without protection.  New partner since then.  No abnormal discharge, pelvic pain, vaginal bleeding.  She ports a "tingling" sensation with urination though no dysuria, urinary frequency, flank pain, abdominal pain, nausea, vomiting, fever.  She has a gynecologist however does not have health insurance therefore she did not call them for evaluation.  The history is provided by the patient.       Past Medical History:  Diagnosis Date  . Asthma     There are no problems to display for this patient.   Past Surgical History:  Procedure Laterality Date  . LIPOMA EXCISION       OB History   No obstetric history on file.     Family History  Problem Relation Age of Onset  . Diabetes Mother   . Cancer Father     Social History   Tobacco Use  . Smoking status: Current Every Day Smoker    Packs/day: 0.50  . Smokeless tobacco: Never Used  Vaping Use  . Vaping Use: Never used  Substance Use Topics  . Alcohol use: Yes    Comment: occ  . Drug use: No    Home Medications Prior to Admission medications   Medication Sig Start Date End Date Taking? Authorizing Provider  doxycycline (VIBRAMYCIN) 100 MG capsule Take 1 capsule (100 mg total) by mouth 2 (two) times daily. 02/07/20  Yes Robinson, [place] N, PA-C  ibuprofen (MOTRIN IB) 200 MG tablet Take 1 tablet (200 mg total) by mouth every 6 (six) hours as needed for up to 20 doses for moderate pain. 08/16/19  Yes Evlyn Kanner, MD  dicyclomine (BENTYL) 20  MG tablet Take 1 tablet (20 mg total) by mouth 2 (two) times daily. 06/15/18 05/19/19  Emi Holes, PA-C    Allergies    Patient has no known allergies.  Review of Systems   Review of Systems  All other systems reviewed and are negative.   Physical Exam Updated Vital Signs BP 113/74 (BP Location: Right Arm)   Pulse 70   Temp 99 F (37.2 C) (Oral)   Resp 16   Ht 5\' 2"  (1.575 m)   Wt 113.4 kg   LMP 01/31/2020   SpO2 100%   BMI 45.73 kg/m   Physical Exam Vitals and nursing note reviewed.  Constitutional:      Appearance: She is well-developed and well-nourished.  HENT:     Head: Normocephalic and atraumatic.  Eyes:     Conjunctiva/sclera: Conjunctivae normal.  Pulmonary:     Effort: Pulmonary effort is normal.  Abdominal:     Palpations: Abdomen is soft.  Genitourinary:    Comments: Pelvic exam performed with female RN more of a chaperone present.  Scant amount of clear cervical discharge noted.  No cervical motion tenderness, no adnexal tenderness or fullness.  No rashes or lesions. Skin:    General: Skin is warm.  Neurological:     Mental Status: She is alert.  Psychiatric:  Mood and Affect: Mood and affect normal.        Behavior: Behavior normal.     ED Results / Procedures / Treatments   Labs (all labs ordered are listed, but only abnormal results are displayed) Labs Reviewed  WET PREP, GENITAL - Abnormal; Notable for the following components:      Result Value   Clue Cells Wet Prep HPF POC PRESENT (*)    All other components within normal limits  URINALYSIS, ROUTINE W REFLEX MICROSCOPIC - Abnormal; Notable for the following components:   APPearance HAZY (*)    Hgb urine dipstick SMALL (*)    Leukocytes,Ua TRACE (*)    All other components within normal limits  URINALYSIS, MICROSCOPIC (REFLEX) - Abnormal; Notable for the following components:   Bacteria, UA FEW (*)    All other components within normal limits  HIV ANTIBODY (ROUTINE TESTING W  REFLEX)  RPR  GC/CHLAMYDIA PROBE AMP ([place]) NOT AT [facility]    EKG None  Radiology No results found.  Procedures Procedures (including critical care time)  Medications Ordered in ED Medications  cefTRIAXone (ROCEPHIN) injection 500 mg (has no administration in time range)  doxycycline (VIBRA-TABS) tablet 100 mg (has no administration in time range)    ED Course  I have reviewed the triage vital signs and the nursing notes.  Pertinent labs & imaging results that were available during my care of the patient were reviewed by me and considered in my medical decision making (see chart for details).    MDM Rules/Calculators/A&P                         Patient presenting with concern for exposure to chlamydia.  Relatively asymptomatic other than some tingling sensation with urination.  UA appears to be contaminated specimen, no other apparent symptoms of UTI.    Do not believe patient warrants antibiotic treatment for UTI.  Will send culture. Pelvic exam is not concerning for PID, no tenderness on exam or abnormal discharge noted. Treated prophylactically in the ED with Rocephin and doxycycline.  She is aware she has STD cultures pending including GC/chlamydia, HIV, RPR.  Instructed to avoid any intercourse until she has all of her results and to inform any sexual partners of positive results.  She is provided with referral to [facility] women's clinic for follow-up and instructed she can also report to the local health department for any further testing or treatment needs.  Patient is appropriate for discharge   Final Clinical Impression(s) / ED Diagnoses Final diagnoses:  Possible exposure to STD    Rx / DC Orders ED Discharge Orders         Ordered    doxycycline (VIBRAMYCIN) 100 MG capsule  2 times daily        02/07/20 1800           Robinson, [place] N, [place] 02/07/20 1806    Tegeler, Canary Brim, MD 02/08/20 [phone]

## 2020-02-07 NOTE — Discharge Instructions (Addendum)
Please read the instructions below.  Talk with your primary care provider about any new medications.  Please schedule an appointment for follow up with your OBGYN or primary care.  Please take the antibiotic, Doxycycline, every 12 hours until gone. A side effect of this medication includes hypersensitivity to the suns rays - please take measures to protect your skin from the sun while taking this medication. You will receive a call from the hospital if your test results come back positive. Avoid all sexual activity until you know your test results. If your results come back positive, it is important that you inform all of your sexual partners.  Return to the ER for high fever, severe abdominal pain, or new or worsening symptoms.   

## 2020-02-08 LAB — RPR
RPR Ser Ql: REACTIVE — AB
RPR Titer: 1:2 {titer}

## 2020-02-08 LAB — HIV ANTIBODY (ROUTINE TESTING W REFLEX): HIV Screen 4th Generation wRfx: NONREACTIVE

## 2020-02-09 LAB — GC/CHLAMYDIA PROBE AMP (~~LOC~~) NOT AT ARMC
Chlamydia: NEGATIVE
Comment: NEGATIVE
Comment: NORMAL
Neisseria Gonorrhea: NEGATIVE

## 2020-02-09 LAB — T.PALLIDUM AB, TOTAL: T Pallidum Abs: NONREACTIVE

## 2020-02-09 LAB — URINE CULTURE

## 2020-12-01 ENCOUNTER — Encounter (HOSPITAL_BASED_OUTPATIENT_CLINIC_OR_DEPARTMENT_OTHER): Payer: Self-pay | Admitting: *Deleted

## 2020-12-01 ENCOUNTER — Other Ambulatory Visit: Payer: Self-pay

## 2020-12-01 DIAGNOSIS — O26851 Spotting complicating pregnancy, first trimester: Secondary | ICD-10-CM | POA: Insufficient documentation

## 2020-12-01 DIAGNOSIS — Z5321 Procedure and treatment not carried out due to patient leaving prior to being seen by health care provider: Secondary | ICD-10-CM | POA: Diagnosis not present

## 2020-12-01 DIAGNOSIS — Z3A00 Weeks of gestation of pregnancy not specified: Secondary | ICD-10-CM | POA: Insufficient documentation

## 2020-12-01 LAB — PREGNANCY, URINE: Preg Test, Ur: NEGATIVE

## 2020-12-01 NOTE — ED Triage Notes (Signed)
Pt reports +preg test one week ago. Last night began having spotting, reports heavier bleeding this morning. Now states she has been passing "blood clots and mucous". Reports she has used "6 overnight pads" in the last hour. States she feels weak and hasn't been able to eat today

## 2020-12-02 ENCOUNTER — Emergency Department (HOSPITAL_BASED_OUTPATIENT_CLINIC_OR_DEPARTMENT_OTHER)
Admission: EM | Admit: 2020-12-02 | Discharge: 2020-12-02 | Disposition: A | Discharge status: Home / Self Care | Payer: Medicaid Other | Attending: Emergency Medicine | Admitting: Emergency Medicine

## 2020-12-02 NOTE — ED Notes (Signed)
Called for room no answer

## 2020-12-03 IMAGING — CT CT ABDOMEN AND PELVIS WITH CONTRAST
2 of 4 series · 16 of 46 positions shown, 18 images · IV contrast (Omni 300)
Comparison: None.

CLINICAL DATA: Abdominal pain and cramping for 4 days

EXAM:
CT ABDOMEN AND PELVIS WITH CONTRAST
TECHNIQUE: Multidetector CT imaging of the abdomen and pelvis was performed
using the standard protocol following bolus administration of
intravenous contrast.
CONTRAST:  100mL OMNIPAQUE IOHEXOL 300 MG/ML  SOLN

[Series 3: a/p w/ 5mm · axial · 0.77mm/px · z∈[+908,+1338]mm · 13 of 94 slices shown, 15 images]
[im 4/94  soft-tissue]
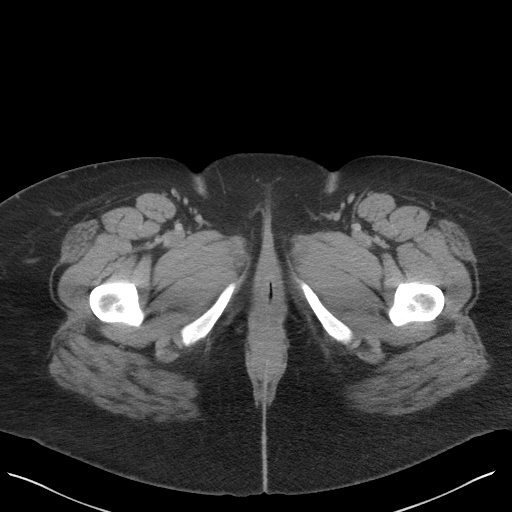
[im 4/94  bone]
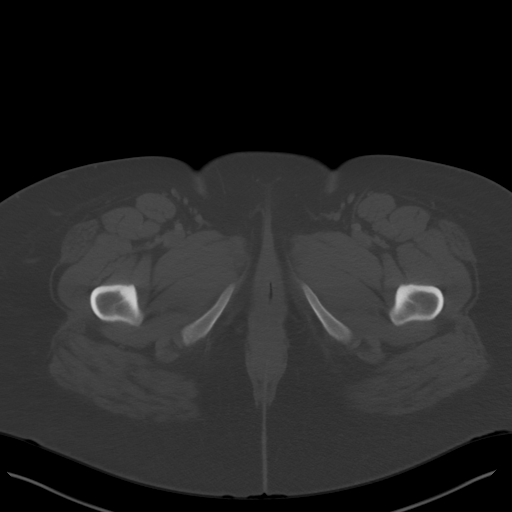
[im 12/94  soft-tissue]
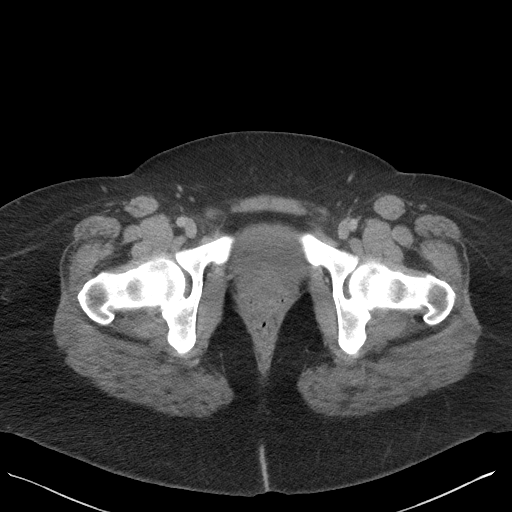
[im 20/94  soft-tissue]
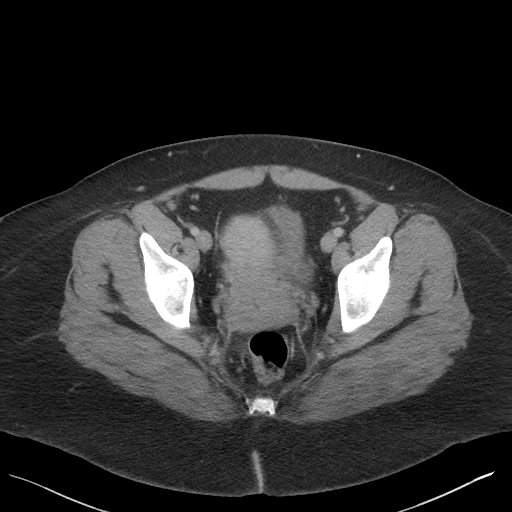
[im 28/94  soft-tissue]
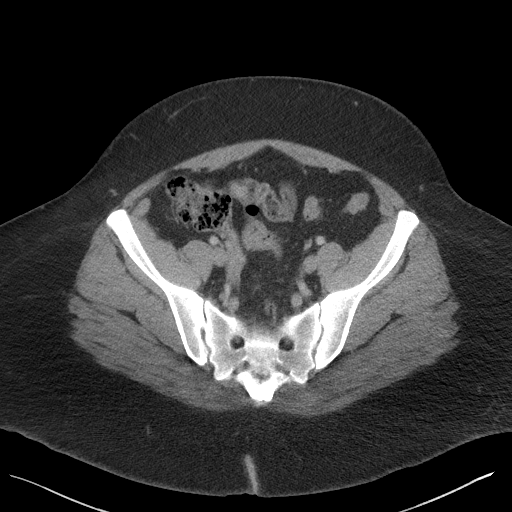
[im 32/94  soft-tissue]
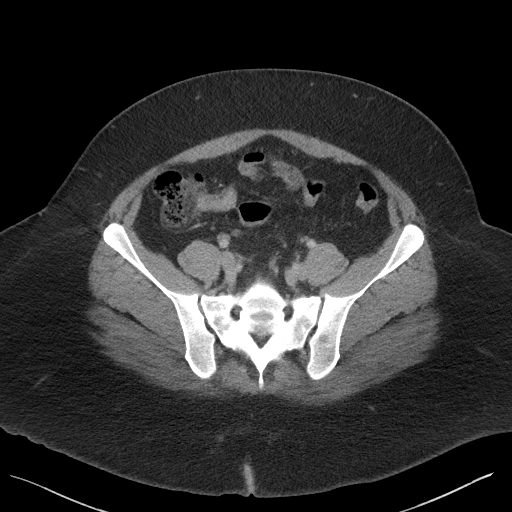
[im 39/94  soft-tissue]
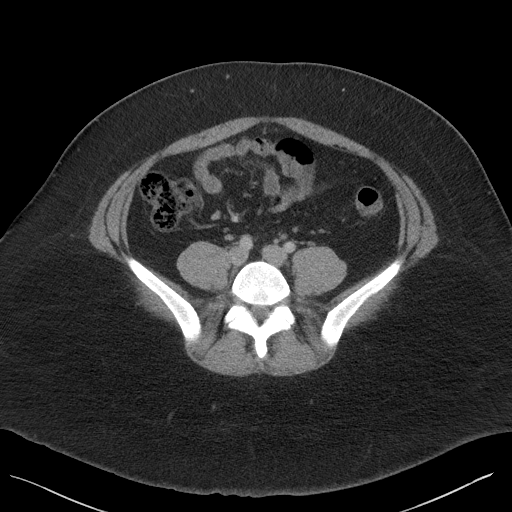
[im 47/94  soft-tissue]
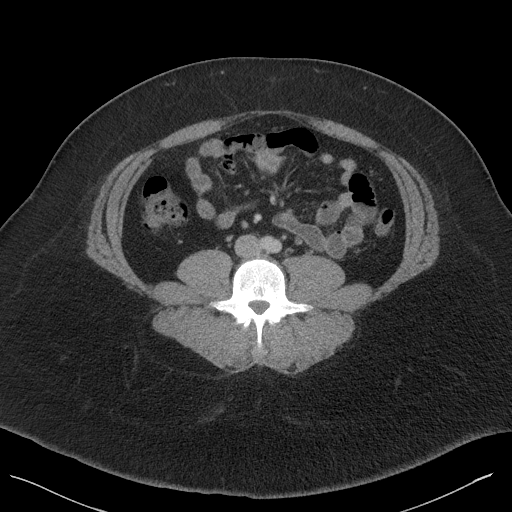
[im 55/94  soft-tissue]
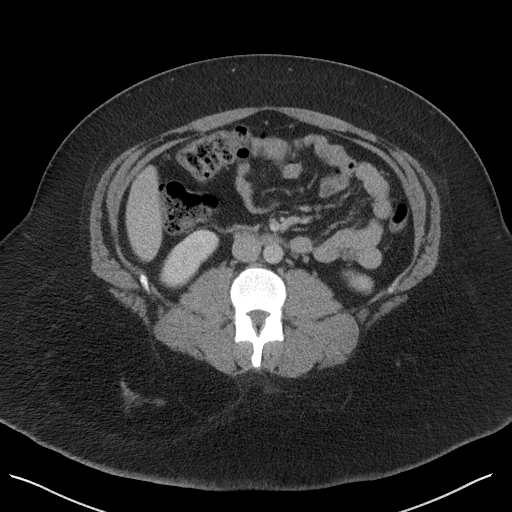
[im 63/94  soft-tissue]
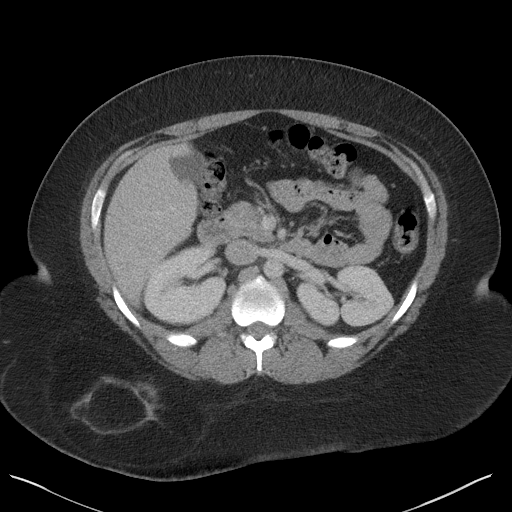
[im 63/94  bone]
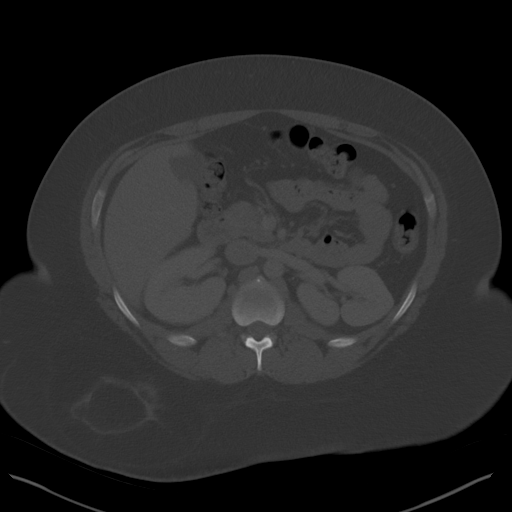
[im 66/94  soft-tissue]
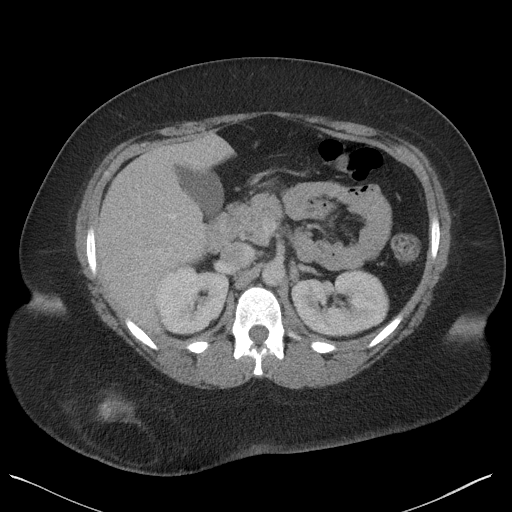
[im 74/94  soft-tissue]
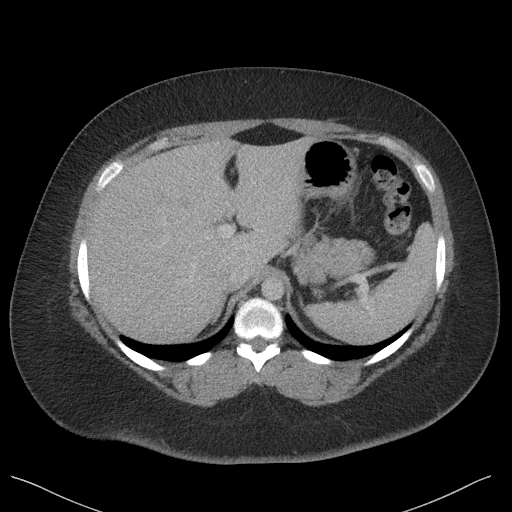
[im 82/94  soft-tissue]
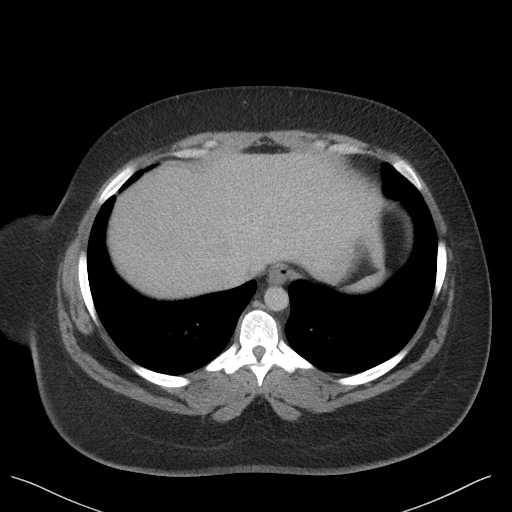
[im 90/94  soft-tissue]
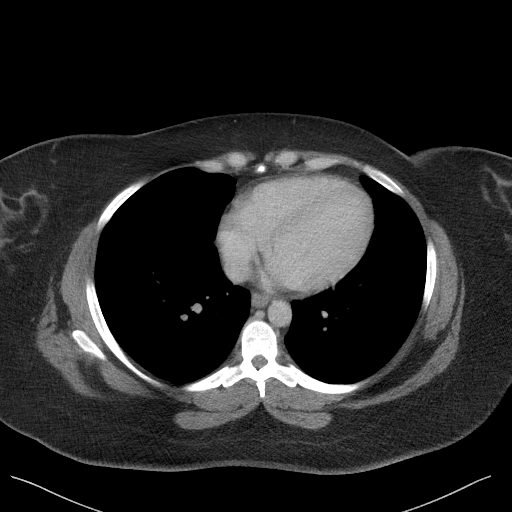

[Series 6: a/p w/ cor · coronal · 0.92mm/px · 3 of 151 slices shown]
[im 51/151  soft-tissue]
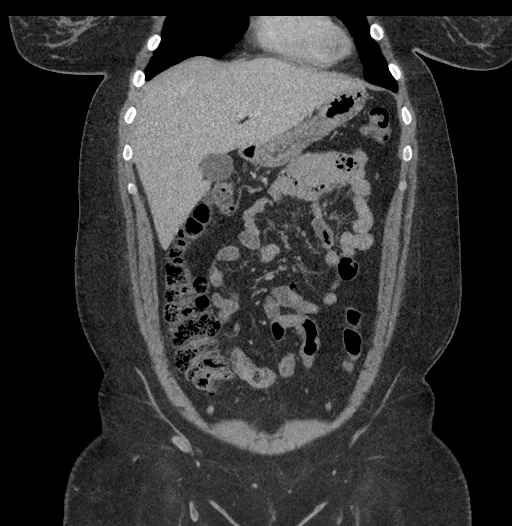
[im 67/151  soft-tissue]
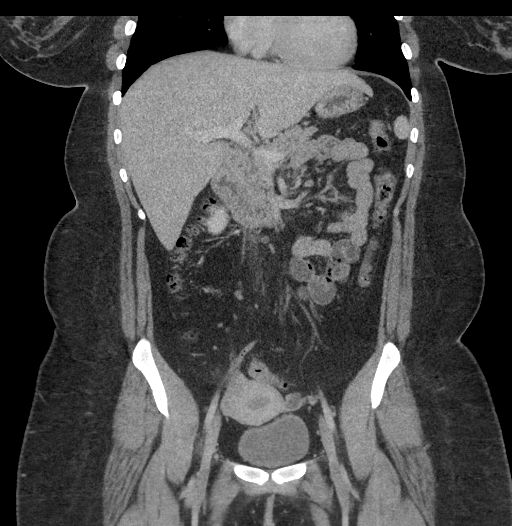
[im 84/151  soft-tissue]
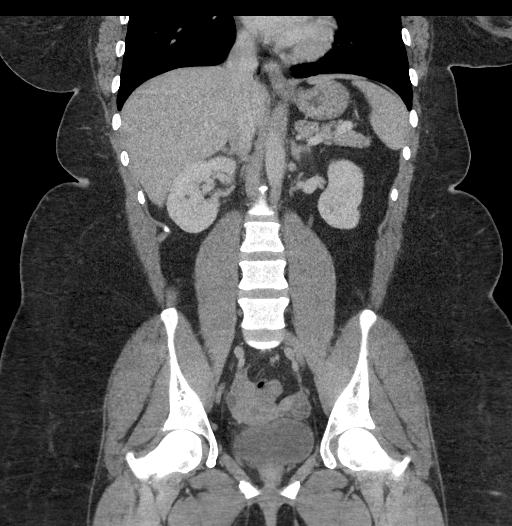

[16 of 46 positions shown; findings below may reference images not displayed]

FINDINGS: Lower chest: Lung bases are clear.

Hepatobiliary: No focal liver lesions are apparent. The gallbladder
wall is not appreciably thickened. There is no biliary duct
dilatation.

Pancreas: There is no pancreatic mass or inflammatory focus.

Spleen: No splenic lesions are evident.

Adrenals/Urinary Tract: Adrenals bilaterally appear unremarkable.
Kidneys bilaterally show no evident mass or hydronephrosis on either
side. There is a junctional parenchymal defect on the left, an
anatomic variant. There is no evident renal or ureteral calculus on
either side. Urinary bladder is midline with wall thickness within
normal limits.

Stomach/Bowel: There is no appreciable bowel wall or mesenteric
thickening. There is no evident bowel obstruction. There is moderate
stool in the colon. The terminal ileum appears normal. There is no
free air or portal venous air.

Vascular/Lymphatic: There is no abdominal aortic aneurysm. No
vascular lesions are evident. There is no adenopathy in the abdomen
or pelvis.

Reproductive: The uterus is anteverted. There is no demonstrable
pelvic mass.

Other: Appendix appears normal. There is no abscess or ascites in
the abdomen or pelvis. There is a sizable lipoma in the posterior
abdominal wall at the mid kidney level measuring 15.7 x 10.1 cm.
There is apparent scarring in the midportion of this lipoma.

Musculoskeletal: There are no blastic or lytic bone lesions. No
intramuscular lesions are evident.
IMPRESSION: 1. Appendix appears normal. No evident bowel obstruction. No abscess
in the abdomen or pelvis.

2. Apparent lipoma arising in the posterior right abdominal wall at
the level of the mid right kidney, measuring approximately 15.7 x
10.1 cm. There is what appears to be scarring in the midportion of
this area of apparent lipoma. This area does not appear nodular.
Suspect scarring secondary to previous trauma in this area.
Neoplastic degeneration is possible in this area. Given this
appearance, a follow-up CT in 3-4 months may be advisable to assess
for stability.

3. No evident renal or ureteral calculus. No hydronephrosis. Urinary
bladder wall thickness is within normal limits.

## 2021-04-03 ENCOUNTER — Other Ambulatory Visit: Payer: Self-pay | Admitting: General Surgery

## 2021-04-09 ENCOUNTER — Emergency Department (HOSPITAL_COMMUNITY)
Admission: EM | Admit: 2021-04-09 | Discharge: 2021-04-09 | Disposition: A | Discharge status: Home / Self Care | Payer: Medicaid Other | Attending: Emergency Medicine | Admitting: Emergency Medicine

## 2021-04-09 ENCOUNTER — Other Ambulatory Visit: Payer: Self-pay

## 2021-04-09 DIAGNOSIS — G8918 Other acute postprocedural pain: Secondary | ICD-10-CM | POA: Diagnosis not present

## 2021-04-09 DIAGNOSIS — L7622 Postprocedural hemorrhage and hematoma of skin and subcutaneous tissue following other procedure: Secondary | ICD-10-CM | POA: Diagnosis present

## 2021-04-09 LAB — URINALYSIS, ROUTINE W REFLEX MICROSCOPIC
Bilirubin Urine: NEGATIVE
Glucose, UA: NEGATIVE mg/dL
Ketones, ur: NEGATIVE mg/dL
Nitrite: NEGATIVE
Protein, ur: NEGATIVE mg/dL
Specific Gravity, Urine: 1.012 (ref 1.005–1.030)
pH: 6 (ref 5.0–8.0)

## 2021-04-09 MED ORDER — HYDROMORPHONE HCL 1 MG/ML IJ SOLN
1.0000 mg | Freq: Once | INTRAMUSCULAR | Status: AC
  Administered 2021-04-09: 1 mg via INTRAMUSCULAR
  Filled 2021-04-09: qty 1

## 2021-04-09 NOTE — Discharge Instructions (Signed)
As discussed, your evaluation today has been largely reassuring.  But, it is important that you monitor your condition carefully, and do not hesitate to return to the ED if you develop new, or concerning changes in your condition. ? ?Otherwise, please follow-up with your physician for appropriate ongoing care. ? ?

## 2021-04-09 NOTE — Progress Notes (Signed)
? ? ?   ?Subjective: ?Pt known to Dr. Kieth Brightly as she underwent a lipoma removal off of her right lower back on 3/9.  She has been doing well, but recently developed some pain at this site.  She called our office yesterday to request some gabapentin which was prescribed for her.  This morning she woke up with a pool of blood noted where she had been laying.  She presented to the ED for evaluation.  There has been no further bleeding here.  We have been asked to see her for evaluation of her wound. ? ?ROS: See above, otherwise other systems negative ? ?Objective: ?Vital signs in last 24 hours: ?Temp:  [98.9 ?F (37.2 ?C)] 98.9 ?F (37.2 ?C) (03/14 [id number]) ?Pulse Rate:  [id number] (03/14 1014) ?Resp:  [15-16] 15 (03/14 1014) ?BP: (107-126)/(70-78) 107/70 (03/14 1014) ?SpO2:  [99 %-100 %] 100 % (03/14 1014) ?Weight:  [108.9 kg] 108.9 kg (03/14 1014) ?  ? ?Intake/Output from previous day: ?No intake/output data recorded. ?Intake/Output this shift: ?No intake/output data recorded. ? ?PE: ?Skin: right posterior flank with a large transverse incision that is c/d/I with subcutaneous stitch still in place.  Slight separation in the mid portion likely where she evacuated from this morning.  She does have some peri-incision ecchymosis noted.  No erythema or heat noted.  No further drainage, bloody or purulent noted. ? ?Lab Results:  ?No results for input(s): WBC, HGB, HCT, PLT in the last 72 hours. ?BMET ?No results for input(s): NA, K, CL, CO2, GLUCOSE, BUN, CREATININE, CALCIUM in the last 72 hours. ?PT/INR ?No results for input(s): LABPROT, INR in the last 72 hours. ?CMP  ?   ?Component Value Date/Time  ? NA 137 08/14/2019 0148  ? K 3.6 08/14/2019 0148  ? CL 104 08/14/2019 0148  ? CO2 22 08/14/2019 0148  ? GLUCOSE 101 (H) 08/14/2019 0148  ? BUN 12 08/14/2019 0148  ? CREATININE 0.70 08/14/2019 0148  ? CALCIUM 9.0 08/14/2019 0148  ? PROT 7.4 08/14/2019 0148  ? ALBUMIN 3.6 08/14/2019 0148  ? AST 18 08/14/2019 0148  ? ALT 23  08/14/2019 0148  ? ALKPHOS 82 08/14/2019 0148  ? BILITOT 0.4 08/14/2019 0148  ? GFRNONAA >60 08/14/2019 0148  ? GFRAA >60 08/14/2019 0148  ? ?Lipase  ?   ?Component Value Date/Time  ? LIPASE 27 08/14/2019 0148  ? ? ? ? ? ?Studies/Results: ?No results found. ? ?Anti-infectives: ?Anti-infectives (From admission, onward)  ? ? None  ? ?  ? ? ? ?Assessment/Plan ?POD 5, s/p excision of lipoma, Dr. Kieth Brightly ?The patient appears to likely have evacuated a hematoma from underneath her incision.  There is no evidence of infection, fevers, or further bleeding.  Her incision is still healing well and dermabond is still present over most of the incision.  No further intervention needed at this time.  She has some medication at home as needed for pain.  She has follow up witih Dr. Kieth Brightly on 4/5.  We discussed if she has any further concerns she should call our office and we have an urgent office where we can see her as well without having to return to the ED unless it were an emergency.  Discussed with Dr. Vanita Panda.  Stable for DC home from ED.  ? ? ? LOS: 0 days  ? ? ?Henreitta Cea , PA-C ?[facility] ?04/09/2021, 10:28 AM ?Please see Amion for pager number during day hours 7:00am-4:30pm or 7:00am -11:30am on weekends ? ?

## 2021-04-09 NOTE — ED Notes (Signed)
General surgery at bedside. 

## 2021-04-09 NOTE — ED Notes (Addendum)
Pt made aware of needing UA sample. Pt states she used restroom before coming. Unable to provide at this time. Will attempt in 30 mins ?

## 2021-04-09 NOTE — ED Notes (Signed)
Patient called out reporting her site was bleeding, RN entered room to find minimal blood on the skin and site not bleeding at this time. Patient assessed and cleaned. Patient reports pain improved. ?

## 2021-04-09 NOTE — ED Triage Notes (Signed)
Pt. Stated, I had a lipoma removed last Wednesday and this morning when I turned and there was a lot of blood on my sheets which there has not been. ?The wound is on the lower back. ?

## 2021-04-09 NOTE — ED Provider Notes (Signed)
?[facility] EMERGENCY DEPARTMENT ?Provider Note ? ? ?CSN: [id number] ?Arrival date & time: 04/09/21  0915 ? ?  ? ?History ? ?Chief Complaint  ?Patient presents with  ? Post-op Problem  ? Coagulation Disorder  ? ? ?Hannah Haney is a 31 y.o. female. ? ?HPI ?Patient presents with concern of bleeding and pain from surgical site.  Patient had lipoma removed 1 week ago.  She was recovering generally well, though she was requiring ongoing narcotic analgesia and recently started gabapentin.  Today, after returning she felt sudden onset of pain, bleeding in the surgical area.  Since that time the pain has been persistent in that area without syncope, lightheadedness, dyspnea, chest pain. ?  ? ?Home Medications ?Prior to Admission medications   ?Medication Sig Start Date End Date Taking? Authorizing Provider  ?doxycycline (VIBRAMYCIN) 100 MG capsule Take 1 capsule (100 mg total) by mouth 2 (two) times daily. 02/07/20   Robinson, [place] N, PA-C  ?ibuprofen (MOTRIN IB) 200 MG tablet Take 1 tablet (200 mg total) by mouth every 6 (six) hours as needed for up to 20 doses for moderate pain. 08/16/19   Evlyn Kanner, MD  ?dicyclomine (BENTYL) 20 MG tablet Take 1 tablet (20 mg total) by mouth 2 (two) times daily. 06/15/18 05/19/19  Emi Holes, PA-C  ?   ? ?Allergies    ?Patient has no known allergies.   ? ?Review of Systems   ?Review of Systems  ?Constitutional:   ?     Per HPI, otherwise negative  ?HENT:    ?     Per HPI, otherwise negative  ?Respiratory:    ?     Per HPI, otherwise negative  ?Cardiovascular:   ?     Per HPI, otherwise negative  ?Gastrointestinal:  Negative for vomiting.  ?Endocrine:  ?     Negative aside from HPI  ?Genitourinary:   ?     Patient had some dysuria following Foley catheter placement during surgery last week.  She requests urinalysis evaluation.  ?Musculoskeletal:   ?     Per HPI, otherwise negative  ?Skin:  Positive for wound.  ?Neurological:  Negative for syncope.   ? ?Physical Exam ?Updated Vital Signs ?BP 126/78 (BP Location: Right Arm)   Pulse (!) 108   Temp 98.9 ?F (37.2 ?C) (Oral)   Resp 16   LMP 10/22/2020 Comment: reports + preg test last week  SpO2 99%  ?Physical Exam ?Vitals and nursing note reviewed.  ?Constitutional:   ?   Appearance: She is well-developed. She is obese.  ?HENT:  ?   Head: Normocephalic and atraumatic.  ?Eyes:  ?   Conjunctiva/sclera: Conjunctivae normal.  ?Cardiovascular:  ?   Rate and Rhythm: Normal rate and regular rhythm.  ?Pulmonary:  ?   Effort: Pulmonary effort is normal. No respiratory distress.  ?   Breath sounds: Normal breath sounds. No stridor.  ?Abdominal:  ?   General: There is no distension.  ?Skin: ?   General: Skin is warm and dry.  ? ?    ?Neurological:  ?   Mental Status: She is alert and oriented to person, place, and time.  ?   Cranial Nerves: No cranial nerve deficit.  ?Psychiatric:     ?   Mood and Affect: Mood normal.  ? ? ?ED Results / Procedures / Treatments   ?Labs ?(all labs ordered are listed, but only abnormal results are displayed) ?Labs Reviewed  ?URINALYSIS, ROUTINE W REFLEX MICROSCOPIC  ? ? ?  EKG ?None ? ?Radiology ?No results found. ? ?Procedures ?Procedures  ? ? ?Medications Ordered in ED ?Medications  ?HYDROmorphone (DILAUDID) injection 1 mg (1 mg Intramuscular Given 04/09/21 1009)  ? ? ?ED Course/ Medical Decision Making/ A&P ?This patient presents to the ED for concern of pain and bleeding from the surgical site, this involves an extensive number of treatment options, and is a complaint that carries with it a high risk of complications and morbidity.  The differential diagnosis includes postop hematoma with spontaneous drainage versus infection versus postop bleed ? ? ?Co morbidities that complicate the patient evaluation ? ?Obesity ? ?Social Determinants of Health: ? ?Obesity ? ?Additional history obtained: ? ?Additional history and/or information obtained from chart review ?External records from outside  source obtained and reviewed including surgical lipoma removal without complication 1 week ago ? ? ?After the initial evaluation, orders, including: Surgical consult, urinalysis, analgesics IM were initiated. ? ? ?Patient placed on Cardiac and Pulse-Oximetry Monitors. ?The patient was maintained on a cardiac monitor.  The cardiac monitored showed an rhythm of 110 sinus tach abnormal ?The patient was also maintained on pulse oximetry. The readings were typically ?99% Percent room air normal ? ? ?On repeat evaluation of the patient stayed the same ?12:23 PM ?Patient awake, alert, in no distress, aware of all findings, including generally reassuring urinalysis, reassuring physical exam, recommendation for outpatient surgery follow-up ?Lab Tests: ? ?I personally interpreted labs.  The pertinent results include: Urinalysis consistent with urethritis, no obvious infection ? ? ? ?Consultations Obtained: ? ?I requested consultation with the general surgery,  and discussed presentation postop with bleeding, pain they recommend: Additional outpatient follow-up as needed ? ?Dispostion / Final MDM: ? ?After consideration of the diagnostic results and the patient's response to treatment, she is appropriate for discharge.  Adult female presents 1 week after surgery now with bleeding, pain.  No evidence for bacteremia, sepsis, no evidence for urinary tract infection.  Some suspicion for hematoma.  Case discussed, consider with her surgery colleagues, who will follow the patient in the outpatient setting. ? ?Final Clinical Impression(s) / ED Diagnoses ?Final diagnoses:  ?Post-operative pain  ? ?  ?Gerhard Munch, MD ?04/09/21 1225 ? ?

## 2021-07-29 ENCOUNTER — Ambulatory Visit (HOSPITAL_COMMUNITY)
Admission: EM | Admit: 2021-07-29 | Discharge: 2021-07-29 | Disposition: A | Discharge status: Home / Self Care | Payer: Medicaid Other | Attending: Emergency Medicine | Admitting: Emergency Medicine

## 2021-07-29 ENCOUNTER — Ambulatory Visit (INDEPENDENT_AMBULATORY_CARE_PROVIDER_SITE_OTHER): Payer: Medicaid Other

## 2021-07-29 ENCOUNTER — Encounter (HOSPITAL_COMMUNITY): Payer: Self-pay | Admitting: Emergency Medicine

## 2021-07-29 DIAGNOSIS — R7611 Nonspecific reaction to tuberculin skin test without active tuberculosis: Secondary | ICD-10-CM

## 2021-07-29 MED ORDER — DIPHENHYDRAMINE HCL 25 MG PO CAPS
25.0000 mg | ORAL_CAPSULE | Freq: Once | ORAL | Status: AC
  Administered 2021-07-29: 25 mg via ORAL

## 2021-07-29 MED ORDER — DIPHENHYDRAMINE HCL 25 MG PO CAPS
ORAL_CAPSULE | ORAL | Status: AC
  Filled 2021-07-29: qty 1

## 2021-07-29 NOTE — Discharge Instructions (Addendum)
PPD test today is positive with measurement recording at 15 mm, healthcare workers are considered to be positive when site is greater than 10 mm  A chest x-ray today is completed results are pending, you will be notified of any concerning findings  May use Benadryl every 6 hours as needed to help with pruritus  May also apply topical or your cortisone cream to the site twice daily until cleared  May follow-up with urgent care as needed

## 2021-07-29 NOTE — ED Provider Notes (Signed)
[facility]    CSN: [id number] Arrival date & time: 07/29/21  1603      History   Chief Complaint Chief Complaint  Patient presents with   Allergic Reaction    HPI Hannah Haney is a 31 y.o. female.   Redness, itchiness, beginning 1 day ago, PPD completed 3 days ago,  due for reading today,   Past Medical History:  Diagnosis Date   Asthma     There are no problems to display for this patient.   Past Surgical History:  Procedure Laterality Date   BACK SURGERY     LIPOMA EXCISION      OB History     Gravida  1   Para      Term      Preterm      AB      Living         SAB      IAB      Ectopic      Multiple      Live Births               Home Medications    Prior to Admission medications   Medication Sig Start Date End Date Taking? Authorizing Provider  doxycycline (VIBRAMYCIN) 100 MG capsule Take 1 capsule (100 mg total) by mouth 2 (two) times daily. 02/07/20   Robinson, [place] N, PA-C  ibuprofen (MOTRIN IB) 200 MG tablet Take 1 tablet (200 mg total) by mouth every 6 (six) hours as needed for up to 20 doses for moderate pain. 08/16/19   Evlyn Kanner, MD  dicyclomine (BENTYL) 20 MG tablet Take 1 tablet (20 mg total) by mouth 2 (two) times daily. 06/15/18 05/19/19  Emi Holes, PA-C    Family History Family History  Problem Relation Age of Onset   Diabetes Mother    Cancer Father     Social History Social History   Tobacco Use   Smoking status: Every Day    Packs/day: 0.50    Types: Cigarettes   Smokeless tobacco: Never  Vaping Use   Vaping Use: Never used  Substance Use Topics   Alcohol use: Yes    Comment: occ   Drug use: No     Allergies   Patient has no known allergies.   Review of Systems Review of Systems  Constitutional: Negative.   Respiratory: Negative.    Cardiovascular: Negative.   Musculoskeletal: Negative.   Skin: Negative.   Neurological: Negative.      Physical Exam Triage  Vital Signs ED Triage Vitals  Enc Vitals Group     BP 07/29/21 1651 117/77     Pulse Rate 07/29/21 1651 74     Resp 07/29/21 1651 18     Temp 07/29/21 1651 98.8 F (37.1 C)     Temp Source 07/29/21 1651 Oral     SpO2 07/29/21 1651 98 %     Weight 07/29/21 1652 260 lb (117.9 kg)     Height 07/29/21 1652 5\' 3"  (1.6 m)     Head Circumference --      Peak Flow --      Pain Score 07/29/21 1652 0     Pain Loc --      Pain Edu? --      Excl. in GC? --    No data found.  Updated Vital Signs BP 117/77 (BP Location: Left Arm)   Pulse 74   Temp 98.8 F (37.1 C) (Oral)  Resp 18   Ht 5\' 3"  (1.6 m)   Wt 260 lb (117.9 kg)   LMP 07/16/2021 (Exact Date) Comment: reports + preg test last week  SpO2 98%   Breastfeeding No   BMI 46.06 kg/m   Visual Acuity Right Eye Distance:   Left Eye Distance:   Bilateral Distance:    Right Eye Near:   Left Eye Near:    Bilateral Near:     Physical Exam   UC Treatments / Results  Labs (all labs ordered are listed, but only abnormal results are displayed) Labs Reviewed - No data to display  EKG   Radiology No results found.  Procedures Procedures (including critical care time)  Medications Ordered in UC Medications - No data to display  Initial Impression / Assessment and Plan / UC Course  I have reviewed the triage vital signs and the nursing notes.  Pertinent labs & imaging results that were available during my care of the patient were reviewed by me and considered in my medical decision making (see chart for details).     *** Final Clinical Impressions(s) / UC Diagnoses   Final diagnoses:  None   Discharge Instructions   None    ED Prescriptions   None    PDMP not reviewed this encounter.

## 2021-07-29 NOTE — ED Triage Notes (Signed)
Patient had a TB test placed on left forearm Friday.  Noticed yesterday the area is red and itchy.  Patient denies any OTC medication.

## 2021-11-19 ENCOUNTER — Telehealth: Payer: Self-pay | Admitting: Hematology and Oncology

## 2021-11-19 NOTE — Telephone Encounter (Signed)
Scheduled appt per 10/23 referral. Pt is aware of appt date and time. Pt is aware to arrive 15 mins prior to appt time and to bring and updated insurance card. Pt is aware of appt location.   

## 2021-12-09 ENCOUNTER — Inpatient Hospital Stay: Payer: Medicaid Other | Attending: Hematology and Oncology | Admitting: Hematology and Oncology

## 2021-12-09 DIAGNOSIS — D72829 Elevated white blood cell count, unspecified: Secondary | ICD-10-CM | POA: Insufficient documentation

## 2021-12-09 DIAGNOSIS — D75839 Thrombocytosis, unspecified: Secondary | ICD-10-CM | POA: Insufficient documentation

## 2021-12-09 NOTE — Progress Notes (Deleted)
[facility] CONSULT NOTE  Patient Care Team: Patient, No Pcp Per as PCP - General (General Practice)  CHIEF COMPLAINTS/PURPOSE OF CONSULTATION:  Chronic leukocytosis and thrombocytosis  HISTORY OF PRESENTING ILLNESS:  Hannah Haney 31 y.o. female is here because of elevated WBC.  She was found to have abnormal CBC from routine blood work I have the opportunity to review her blood count dated back to 2020 On 06/15/2018, her white blood cell count is 18, RBC count 5.27, platelet count of 463 On 08/14/2019, white blood cell count 18.7, hemoglobin 14 and platelet count of 502 On 10/08/2021, white blood cell count is 16, RBC count 5 million, hemoglobin 13.2, hematocrit 42 and platelet count 488 On November 13, 2021, white blood cell count 15.7, hemoglobin 13, RBC count 5.18 million, hematocrit 42.5 and platelet count of 452  ***She denies recent infection. The last prescription antibiotics was more than 3 months ago There is not reported symptoms of sinus congestion, cough, urinary frequency/urgency or dysuria, diarrhea, joint swelling/pain or abnormal skin rash.  ***She had no prior history or diagnosis of cancer. Her age appropriate screening programs are up-to-date. The patient has no prior diagnosis of autoimmune disease and was not prescribed corticosteroids related products. *** The patient is a smoker and currently smokes *** pack of cigarettes per day for the last *** years.  MEDICAL HISTORY:  Past Medical History:  Diagnosis Date   Asthma     SURGICAL HISTORY: Past Surgical History:  Procedure Laterality Date   BACK SURGERY     LIPOMA EXCISION      SOCIAL HISTORY: Social History   Socioeconomic History   Marital status: Single    Spouse name: Not on file   Number of children: Not on file   Years of education: Not on file   Highest education level: Not on file  Occupational History   Not on file  Tobacco Use   Smoking status: Every Day    Packs/day:  0.50    Types: Cigarettes   Smokeless tobacco: Never  Vaping Use   Vaping Use: Never used  Substance and Sexual Activity   Alcohol use: Yes    Comment: occ   Drug use: No   Sexual activity: Not on file  Other Topics Concern   Not on file  Social History Narrative   Not on file   Social Determinants of Health   Financial Resource Strain: Not on file  Food Insecurity: Not on file  Transportation Needs: Not on file  Physical Activity: Not on file  Stress: Not on file  Social Connections: Not on file  Intimate Partner Violence: Not on file    FAMILY HISTORY: Family History  Problem Relation Age of Onset   Diabetes Mother    Cancer Father     ALLERGIES:  has No Known Allergies.  MEDICATIONS:  Current Outpatient Medications  Medication Sig Dispense Refill   doxycycline (VIBRAMYCIN) 100 MG capsule Take 1 capsule (100 mg total) by mouth 2 (two) times daily. 14 capsule 0   ibuprofen (MOTRIN IB) 200 MG tablet Take 1 tablet (200 mg total) by mouth every 6 (six) hours as needed for up to 20 doses for moderate pain. 20 tablet 0   No current facility-administered medications for this visit.    REVIEW OF SYSTEMS:   Constitutional: Denies fevers, chills or abnormal night sweats Eyes: Denies blurriness of vision, double vision or watery eyes Ears, nose, mouth, throat, and face: Denies mucositis or sore throat Respiratory:  Denies cough, dyspnea or wheezes Cardiovascular: Denies palpitation, chest discomfort or lower extremity swelling Gastrointestinal:  Denies nausea, heartburn or change in bowel habits Skin: Denies abnormal skin rashes Lymphatics: Denies new lymphadenopathy or easy bruising Neurological:Denies numbness, tingling or new weaknesses Behavioral/Psych: Mood is stable, no new changes  All other systems were reviewed with the patient and are negative.  PHYSICAL EXAMINATION: ECOG PERFORMANCE STATUS: {CHL ONC ECOG PS:[phone]}  There were no vitals filed for this  visit. There were no vitals filed for this visit.  GENERAL:alert, no distress and comfortable SKIN: skin color, texture, turgor are normal, no rashes or significant lesions EYES: normal, conjunctiva are pink and non-injected, sclera clear OROPHARYNX:no exudate, no erythema and lips, buccal mucosa, and tongue normal  NECK: supple, thyroid normal size, non-tender, without nodularity LYMPH:  no palpable lymphadenopathy in the cervical, axillary or inguinal LUNGS: clear to auscultation and percussion with normal breathing effort HEART: regular rate & rhythm and no murmurs and no lower extremity edema ABDOMEN:abdomen soft, non-tender and normal bowel sounds Musculoskeletal:no cyanosis of digits and no clubbing  PSYCH: alert & oriented x 3 with fluent speech NEURO: no focal motor/sensory deficits  LABORATORY DATA:  I have reviewed the data as listed No results found for this or any previous visit (from the past 2160 hour(s)).  RADIOGRAPHIC STUDIES: I have personally reviewed the radiological images as listed and agreed with the findings in the report. No results found.  ASSESSMENT & PLAN  No problem-specific Assessment & Plan notes found for this encounter.   No orders of the defined types were placed in this encounter.   All questions were answered. The patient knows to call the clinic with any problems, questions or concerns. I spent {CHL ONC TIME VISIT - [id number]} counseling the patient face to face. The total time spent in the appointment was {CHL ONC TIME VISIT - SWHQP:[id number]} and more than 50% was on counseling.     Artis Delay, MD 12/09/2021 9:29 AM

## 2022-01-06 ENCOUNTER — Inpatient Hospital Stay: Payer: Medicaid Other | Attending: Hematology and Oncology | Admitting: Hematology and Oncology

## 2022-01-17 ENCOUNTER — Encounter (HOSPITAL_COMMUNITY): Payer: Self-pay | Admitting: Emergency Medicine

## 2022-01-17 ENCOUNTER — Ambulatory Visit (HOSPITAL_COMMUNITY)
Admission: EM | Admit: 2022-01-17 | Discharge: 2022-01-17 | Disposition: A | Discharge status: Home / Self Care | Payer: Medicaid Other | Attending: Family Medicine | Admitting: Family Medicine

## 2022-01-17 DIAGNOSIS — H1031 Unspecified acute conjunctivitis, right eye: Secondary | ICD-10-CM | POA: Diagnosis not present

## 2022-01-17 DIAGNOSIS — J069 Acute upper respiratory infection, unspecified: Secondary | ICD-10-CM

## 2022-01-17 MED ORDER — GENTAMICIN SULFATE 0.3 % OP SOLN: 2.0000 [drp] | Freq: Three times a day (TID) | OPHTHALMIC | 0 refills | Status: AC

## 2022-01-17 MED ORDER — BENZONATATE 100 MG PO CAPS
100.0000 mg | ORAL_CAPSULE | Freq: Three times a day (TID) | ORAL | 0 refills | Status: DC | PRN
Start: 2022-01-17 — End: 2022-10-08

## 2022-01-17 MED ORDER — ALBUTEROL SULFATE HFA 108 (90 BASE) MCG/ACT IN AERS: 2.0000 | INHALATION_SPRAY | RESPIRATORY_TRACT | 0 refills | Status: DC | PRN

## 2022-01-17 NOTE — ED Triage Notes (Addendum)
Sore throat, hoarseness, nasal congestion, cough, headache starting yesterday.  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 Using nyquil to help her sleep since she's been sick, no other meds.  Reports right eye pain/pressure that started on waking this morning, denies visual changes. Reports watery drainage and itching, no yellow drainage. Bright lights bother eye. Scleral injection visible to right outer canthus extending inwards Denies fever/chills, eye trauma/debris

## 2022-01-17 NOTE — Discharge Instructions (Addendum)
Albuterol inhaler--do 2 puffs every 4 hours as needed for shortness of breath or wheezing  Take benzonatate 100 mg, 1 tab every 8 hours as needed for cough.  Put gentamicin eyedrops in the affected eye(s) 3 times daily for 5 days.  You can continue taking NyQuil in the evening and DayQuil in the day

## 2022-01-17 NOTE — ED Provider Notes (Signed)
[facility]    CSN: [id number] Arrival date & time: 01/17/22  1613      History   Chief Complaint Chief Complaint  Patient presents with   Sore Throat   Eye Pain    HPI Hannah Haney is a 31 y.o. female.    Sore Throat  Eye Pain   Here for congestion and cough and rhinorrhea.  Symptoms began yesterday.  She is also been hoarse.  No fever or chills.  No nausea or vomiting or diarrhea.  This morning she woke to find that her right eye was red and it hurts to blink and is irritated.  No discharge so far.  No injury or foreign body.  Last menstrual cycle was December 1.  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  He does have a history of asthma, and she has maybe been a little wheezy when she coughs.  Past Medical History:  Diagnosis Date   Asthma     Patient Active Problem List   Diagnosis Date Noted   Leukocytosis 12/09/2021   Thrombocytosis 12/09/2021    Past Surgical History:  Procedure Laterality Date   BACK SURGERY     LIPOMA EXCISION      OB History     Gravida  1   Para      Term      Preterm      AB      Living         SAB      IAB      Ectopic      Multiple      Live Births               Home Medications    Prior to Admission medications   Medication Sig Start Date End Date Taking? Authorizing Provider  albuterol (VENTOLIN HFA) 108 (90 Base) MCG/ACT inhaler Inhale 2 puffs into the lungs every 4 (four) hours as needed for wheezing or shortness of breath. 01/17/22  Yes Kevona Lupinacci, Janace Aris, MD  benzonatate (TESSALON) 100 MG capsule Take 1 capsule (100 mg total) by mouth 3 (three) times daily as needed for cough. 01/17/22  Yes Zenia Resides, MD  gentamicin (GARAMYCIN) 0.3 % ophthalmic solution Place 2 drops into the right eye 3 (three) times daily for 5 days. 01/17/22 01/22/22 Yes Kelsye Loomer, Janace Aris, MD  Vitamin D, Ergocalciferol, (DRISDOL) 1.25 MG  (50000 UNIT) CAPS capsule Take 50,000 Units by mouth once a week. 12/18/21  Yes [provider]  dicyclomine (BENTYL) 20 MG tablet Take 1 tablet (20 mg total) by mouth 2 (two) times daily. 06/15/18 05/19/19  Emi Holes, PA-C    Family History Family History  Problem Relation Age of Onset   Diabetes Mother    Cancer Father     Social History Social History   Tobacco Use   Smoking status: Every Day    Packs/day: 0.50    Types: Cigarettes   Smokeless tobacco: Never  Vaping Use   Vaping Use: Never used  Substance Use Topics   Alcohol use: Yes    Comment: occ   Drug use: No     Allergies   Patient has no known allergies.   Review of Systems Review of Systems  Eyes:  Positive for pain.     Physical Exam Triage Vital Signs ED Triage Vitals  Enc Vitals Group     BP  01/17/22 1757 106/73     Pulse Rate 01/17/22 1757 84     Resp 01/17/22 1757 16     Temp 01/17/22 1757 98.6 F (37 C)     Temp Source 01/17/22 1757 Oral     SpO2 01/17/22 1757 96 %     Weight --      Height --      Head Circumference --      Peak Flow --      Pain Score 01/17/22 1748 6     Pain Loc --      Pain Edu? --      Excl. in GC? --    No data found.  Updated Vital Signs BP 106/73 (BP Location: Left Arm)   Pulse 84   Temp 98.6 F (37 C) (Oral)   Resp 16   LMP 12/27/2021   SpO2 96%   Visual Acuity Right Eye Distance:   Left Eye Distance:   Bilateral Distance:    Right Eye Near:   Left Eye Near:    Bilateral Near:     Physical Exam Vitals reviewed.  Constitutional:      General: She is not in acute distress.    Appearance: She is not ill-appearing, toxic-appearing or diaphoretic.  HENT:     Right Ear: Tympanic membrane and ear canal normal.     Left Ear: Tympanic membrane and ear canal normal.     Nose: Nose normal.     Mouth/Throat:     Mouth: Mucous membranes are moist.     Comments: There is clear mucus draining in the oropharynx.  No erythema Eyes:      Extraocular Movements: Extraocular movements intact.     Pupils: Pupils are equal, round, and reactive to light.     Comments: Right eye is injected laterally.  The lids are mildly swollen  Cardiovascular:     Rate and Rhythm: Normal rate and regular rhythm.     Heart sounds: No murmur heard. Pulmonary:     Effort: Pulmonary effort is normal. No respiratory distress.     Breath sounds: No stridor. No wheezing, rhonchi or rales.  Musculoskeletal:     Cervical back: Neck supple.  Lymphadenopathy:     Cervical: No cervical adenopathy.  Skin:    Capillary Refill: Capillary refill takes less than 2 seconds.     Coloration: Skin is not jaundiced or pale.  Neurological:     General: No focal deficit present.     Mental Status: She is alert and oriented to person, place, and time.  Psychiatric:        Behavior: Behavior normal.      UC Treatments / Results  Labs (all labs ordered are listed, but only abnormal results are displayed) Labs Reviewed - No data to display  EKG   Radiology No results found.  Procedures Procedures (including critical care time)  Medications Ordered in UC Medications - No data to display  Initial Impression / Assessment and Plan / UC Course  I have reviewed the triage vital signs and the nursing notes.  Pertinent labs & imaging results that were available during my care of the patient were reviewed by me and considered in my medical decision making (see chart for details).        Albuterol is sent in in case she needs it, and gentamicin is sent in for the conjunctivitis.  Tessalon Perles are sent for the cough.  We decided not to repeat her COVID  testing. Final Clinical Impressions(s) / UC Diagnoses   Final diagnoses:  Viral URI with cough  Acute conjunctivitis of right eye, unspecified acute conjunctivitis type     Discharge Instructions      Albuterol inhaler--do 2 puffs every 4 hours as needed for shortness of breath or  wheezing  Take benzonatate 100 mg, 1 tab every 8 hours as needed for cough.  Put gentamicin eyedrops in the affected eye(s) 3 times daily for 5 days.  You can continue taking NyQuil in the evening and DayQuil in the day       ED Prescriptions     Medication Sig Dispense Auth. Provider   albuterol (VENTOLIN HFA) 108 (90 Base) MCG/ACT inhaler Inhale 2 puffs into the lungs every 4 (four) hours as needed for wheezing or shortness of breath. 1 each Zenia Resides, MD   benzonatate (TESSALON) 100 MG capsule Take 1 capsule (100 mg total) by mouth 3 (three) times daily as needed for cough. 21 capsule Zenia Resides, MD   gentamicin (GARAMYCIN) 0.3 % ophthalmic solution Place 2 drops into the right eye 3 (three) times daily for 5 days. 5 mL Zenia Resides, MD      PDMP not reviewed this encounter.   Zenia Resides, MD 01/17/22 [phone]

## 2022-01-21 ENCOUNTER — Encounter (HOSPITAL_BASED_OUTPATIENT_CLINIC_OR_DEPARTMENT_OTHER): Payer: Self-pay | Admitting: Emergency Medicine

## 2022-01-21 ENCOUNTER — Emergency Department (HOSPITAL_BASED_OUTPATIENT_CLINIC_OR_DEPARTMENT_OTHER)
Admission: EM | Admit: 2022-01-21 | Discharge: 2022-01-21 | Disposition: A | Discharge status: Home / Self Care | Payer: Medicaid Other | Attending: Emergency Medicine | Admitting: Emergency Medicine

## 2022-01-21 ENCOUNTER — Emergency Department (HOSPITAL_BASED_OUTPATIENT_CLINIC_OR_DEPARTMENT_OTHER): Payer: Medicaid Other

## 2022-01-21 DIAGNOSIS — B349 Viral infection, unspecified: Secondary | ICD-10-CM | POA: Insufficient documentation

## 2022-01-21 DIAGNOSIS — E860 Dehydration: Secondary | ICD-10-CM | POA: Diagnosis not present

## 2022-01-21 DIAGNOSIS — Z20822 Contact with and (suspected) exposure to covid-19: Secondary | ICD-10-CM | POA: Diagnosis not present

## 2022-01-21 DIAGNOSIS — D72829 Elevated white blood cell count, unspecified: Secondary | ICD-10-CM | POA: Insufficient documentation

## 2022-01-21 DIAGNOSIS — R059 Cough, unspecified: Secondary | ICD-10-CM | POA: Diagnosis present

## 2022-01-21 LAB — CBC WITH DIFFERENTIAL/PLATELET
Abs Immature Granulocytes: 0.04 10*3/uL (ref 0.00–0.07)
Basophils Absolute: 0.1 10*3/uL (ref 0.0–0.1)
Basophils Relative: 1 %
Eosinophils Absolute: 0 10*3/uL (ref 0.0–0.5)
Eosinophils Relative: 0 %
HCT: 46.4 % — ABNORMAL HIGH (ref 36.0–46.0)
Hemoglobin: 15.1 g/dL — ABNORMAL HIGH (ref 12.0–15.0)
Immature Granulocytes: 0 %
Lymphocytes Relative: 34 %
Lymphs Abs: 3.6 10*3/uL (ref 0.7–4.0)
MCH: 25.6 pg — ABNORMAL LOW (ref 26.0–34.0)
MCHC: 32.5 g/dL (ref 30.0–36.0)
MCV: 78.6 fL — ABNORMAL LOW (ref 80.0–100.0)
Monocytes Absolute: 1.4 10*3/uL — ABNORMAL HIGH (ref 0.1–1.0)
Monocytes Relative: 14 %
Neutro Abs: 5.4 10*3/uL (ref 1.7–7.7)
Neutrophils Relative %: 51 %
Platelets: 397 10*3/uL (ref 150–400)
RBC: 5.9 MIL/uL — ABNORMAL HIGH (ref 3.87–5.11)
RDW: 13.5 % (ref 11.5–15.5)
WBC: 10.6 10*3/uL — ABNORMAL HIGH (ref 4.0–10.5)
nRBC: 0 % (ref 0.0–0.2)

## 2022-01-21 LAB — BASIC METABOLIC PANEL
Anion gap: 9 (ref 5–15)
BUN: 15 mg/dL (ref 6–20)
CO2: 21 mmol/L — ABNORMAL LOW (ref 22–32)
Calcium: 8.7 mg/dL — ABNORMAL LOW (ref 8.9–10.3)
Chloride: 105 mmol/L (ref 98–111)
Creatinine, Ser: 0.91 mg/dL (ref 0.44–1.00)
GFR, Estimated: >60 mL/min (ref >60)
Glucose, Bld: 98 mg/dL (ref 70–99)
Potassium: 3.9 mmol/L (ref 3.5–5.1)
Sodium: 135 mmol/L (ref 135–145)

## 2022-01-21 LAB — RESP PANEL BY RT-PCR (RSV, FLU A&B, COVID)  RVPGX2
Influenza A by PCR: NEGATIVE
Influenza B by PCR: NEGATIVE
Resp Syncytial Virus by PCR: NEGATIVE
SARS Coronavirus 2 by RT PCR: NEGATIVE

## 2022-01-21 MED ORDER — LIDOCAINE 5 % EX PTCH: 1.0000 | MEDICATED_PATCH | CUTANEOUS | 0 refills | Status: AC

## 2022-01-21 MED ORDER — LOPERAMIDE HCL 2 MG PO CAPS
4.0000 mg | ORAL_CAPSULE | Freq: Once | ORAL | Status: AC
  Administered 2022-01-21: 4 mg via ORAL
  Filled 2022-01-21: qty 2

## 2022-01-21 MED ORDER — LIDOCAINE 5 % EX PTCH
2.0000 | MEDICATED_PATCH | CUTANEOUS | Status: DC
  Administered 2022-01-21: 2 via TRANSDERMAL
  Filled 2022-01-21: qty 2

## 2022-01-21 MED ORDER — SODIUM CHLORIDE 0.9 % IV BOLUS
1000.0000 mL | Freq: Once | INTRAVENOUS | Status: AC
  Administered 2022-01-21: 1000 mL via INTRAVENOUS

## 2022-01-21 MED ORDER — ACETAMINOPHEN 325 MG PO TABS
650.0000 mg | ORAL_TABLET | Freq: Once | ORAL | Status: AC
  Administered 2022-01-21: 650 mg via ORAL
  Filled 2022-01-21: qty 2

## 2022-01-21 NOTE — ED Triage Notes (Signed)
Patient presents with multiple complaints. R eye discomfort, sore throat, body aches, etc. X several days. Seen at [facility] 12/22 for same, no swabs done.

## 2022-01-21 NOTE — Discharge Instructions (Addendum)
Please take ibuprofen, Tylenol for your fever, drink lots of fluids, and you can take loperamide, for your diarrhea.  You are found to likely have a viral infection, your chest x-ray was clear.  Please return to the ER if you start having severe shortness of breath, intractable nausea or vomiting, or chest discomfort.

## 2022-01-21 NOTE — ED Provider Notes (Signed)
[facility] EMERGENCY DEPARTMENT Provider Note   CSN: [id number] Arrival date & time: 01/21/22  1205     History  Chief Complaint  Patient presents with   Generalized Body Aches   Diarrhea   Dental Pain    Hannah Haney is a 31 y.o. female, no pertinent past medical history, who presents to the ED secondary to right eye discomfort, sore throat, body aches, cough for the last 5 days.  She states that she just feels very achy, and has been having to run to the bathroom repeatedly given every time she eats she has diarrhea.  She also notes that she has been more short of breath and has been using her inhaler more frequently.  She notes that she went to urgent care on 12/22 for similar symptoms, and her eye redness has resolved, and she is still taking her drops.  She is mostly concerned because she has had limited p.o. intake, and just feels very dehydrated.  She states it hurts to cough, around both sides of her rib cage.  No chest pain however.    Home Medications Prior to Admission medications   Medication Sig Start Date End Date Taking? Authorizing Provider  lidocaine (LIDODERM) 5 % Place 1 patch onto the skin daily. Remove & Discard patch within 12 hours or as directed by MD 01/21/22  Yes Rickell Wiehe L, PA  albuterol (VENTOLIN HFA) 108 (90 Base) MCG/ACT inhaler Inhale 2 puffs into the lungs every 4 (four) hours as needed for wheezing or shortness of breath. 01/17/22   Zenia Resides, MD  benzonatate (TESSALON) 100 MG capsule Take 1 capsule (100 mg total) by mouth 3 (three) times daily as needed for cough. 01/17/22   Zenia Resides, MD  gentamicin (GARAMYCIN) 0.3 % ophthalmic solution Place 2 drops into the right eye 3 (three) times daily for 5 days. 01/17/22 01/22/22  Zenia Resides, MD  Vitamin D, Ergocalciferol, (DRISDOL) 1.25 MG (50000 UNIT) CAPS capsule Take 50,000 Units by mouth once a week. 12/18/21   [provider]  dicyclomine (BENTYL) 20 MG  tablet Take 1 tablet (20 mg total) by mouth 2 (two) times daily. 06/15/18 05/19/19  Emi Holes, PA-C      Allergies    Patient has no known allergies.    Review of Systems   Review of Systems  Constitutional:  Positive for appetite change, fatigue and fever.  HENT:  Positive for sore throat.   Respiratory:  Positive for cough and shortness of breath.   Gastrointestinal:  Positive for diarrhea. Negative for abdominal pain.    Physical Exam Updated Vital Signs BP 109/74 (BP Location: Right Arm)   Pulse 98   Temp 99.5 F (37.5 C) (Oral)   Resp 20   Ht 5\' 3"  (1.6 m)   Wt 122.5 kg   LMP 12/27/2021   SpO2 95%   BMI 47.83 kg/m  Physical Exam Vitals and nursing note reviewed.  Constitutional:      General: She is not in acute distress.    Appearance: She is well-developed.  HENT:     Head: Normocephalic and atraumatic.  Eyes:     Conjunctiva/sclera: Conjunctivae normal.  Cardiovascular:     Rate and Rhythm: Normal rate and regular rhythm.     Heart sounds: No murmur heard. Pulmonary:     Effort: Pulmonary effort is normal. No respiratory distress.     Comments: +crackles in LLL Chest:     Comments: +TTP of bilateral  rib cage--primarily under breasts Abdominal:     Palpations: Abdomen is soft.     Tenderness: There is no abdominal tenderness.  Musculoskeletal:        General: No swelling.     Cervical back: Neck supple.  Skin:    General: Skin is warm and dry.     Capillary Refill: Capillary refill takes less than 2 seconds.  Neurological:     Mental Status: She is alert.  Psychiatric:        Mood and Affect: Mood normal.     ED Results / Procedures / Treatments   Labs (all labs ordered are listed, but only abnormal results are displayed) Labs Reviewed  CBC WITH DIFFERENTIAL/PLATELET - Abnormal; Notable for the following components:      Result Value   WBC 10.6 (*)    RBC 5.90 (*)    Hemoglobin 15.1 (*)    HCT 46.4 (*)    MCV 78.6 (*)    MCH 25.6 (*)     Monocytes Absolute 1.4 (*)    All other components within normal limits  BASIC METABOLIC PANEL - Abnormal; Notable for the following components:   CO2 21 (*)    Calcium 8.7 (*)    All other components within normal limits  RESP PANEL BY RT-PCR (RSV, FLU A&B, COVID)  [id number]    EKG None  Radiology DG Chest 2 View  Result Date: 01/21/2022 CLINICAL DATA:  Shortness of breath.  Fatigue.  Diarrhea. EXAM: CHEST - 2 VIEW COMPARISON:  July 29, 2021 FINDINGS: The heart size and mediastinal contours are within normal limits. Both lungs are clear. The visualized skeletal structures are unremarkable. IMPRESSION: No active cardiopulmonary disease. Electronically Signed   By: Gerome Sam III M.D.   On: 01/21/2022 15:48    Procedures Procedures    Medications Ordered in ED Medications  lidocaine (LIDODERM) 5 % 2 patch (2 patches Transdermal Patch Applied 01/21/22 1551)  acetaminophen (TYLENOL) tablet 650 mg (650 mg Oral Given 01/21/22 1548)  loperamide (IMODIUM) capsule 4 mg (4 mg Oral Given 01/21/22 1548)  sodium chloride 0.9 % bolus 1,000 mL (1,000 mLs Intravenous New Bag/Given 01/21/22 1559)    ED Course/ Medical Decision Making/ A&P                           Medical Decision Making Patient is a 31 year old female, here for sore throat, body aches, diarrhea, is well-appearing, has moist mucous membranes, requesting some IV fluids.  She has no abdominal tenderness on exam, and she also complained of right eye, but states that is currently being treated.  She has no erythema to her right eye, and extraocular movements intact.  Believe that likely she has a viral illness, we will obtain viral swabs, chest x-ray for further evaluation, and labs secondary to patient's concern for dehydration.  Amount and/or Complexity of Data Reviewed Labs: ordered.    Details: Unremarkable mild white count of 10.6 K Radiology: ordered.    Details: Chest x-ray clear Discussion of management or test  interpretation with external provider(s): Patient given IV fluids, Tylenol, loperamide, and lidocaine patch, with great improvement of symptoms.  She states she is feeling better, but concerned that she may not be able to go back to work 3 more days given off her work.  I discussed return symptoms, such as severe shortness of breath, chest pain, intractable nausea or vomiting.  Lidocaine sent to pharmacy.  Discussed loperamide use  for home.   Risk OTC drugs. Prescription drug management.    Final Clinical Impression(s) / ED Diagnoses Final diagnoses:  Viral illness    Rx / DC Orders ED Discharge Orders          Ordered    lidocaine (LIDODERM) 5 %  Every 24 hours        01/21/22 1647              Darlene Brozowski Elbert Ewings, PA 01/21/22 1650    Pricilla Loveless, MD 01/23/22 1005

## 2022-02-03 IMAGING — US US PELVIS COMPLETE
1 series · 13 of 25 positions shown · non-contrast
Comparison: 01/11/2019

CLINICAL DATA: Intermittent lower abdominal pain x3 weeks

EXAM:
TRANSABDOMINAL ULTRASOUND OF PELVIS
TECHNIQUE: Transabdominal ultrasound examination of the pelvis was performed
including evaluation of the uterus, ovaries, adnexal regions, and
pelvic cul-de-sac.

[Series 1: us pelvis complete · 73 acquisitions, 13 frames shown]
[im 1/73]
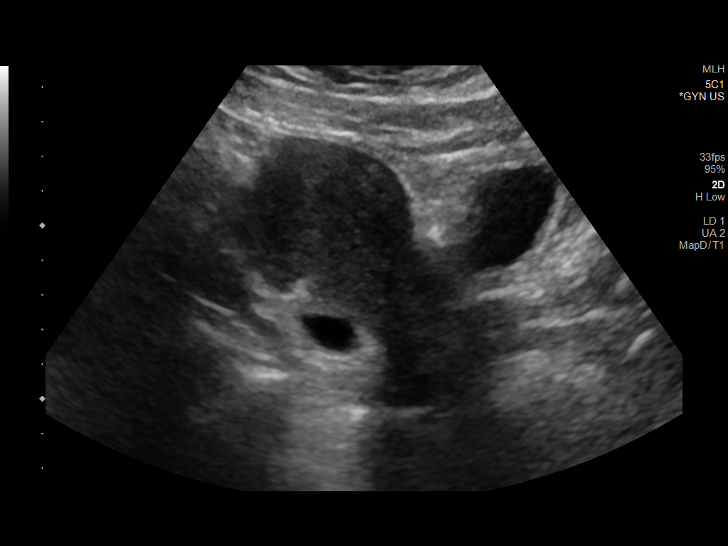
[im 7/73]
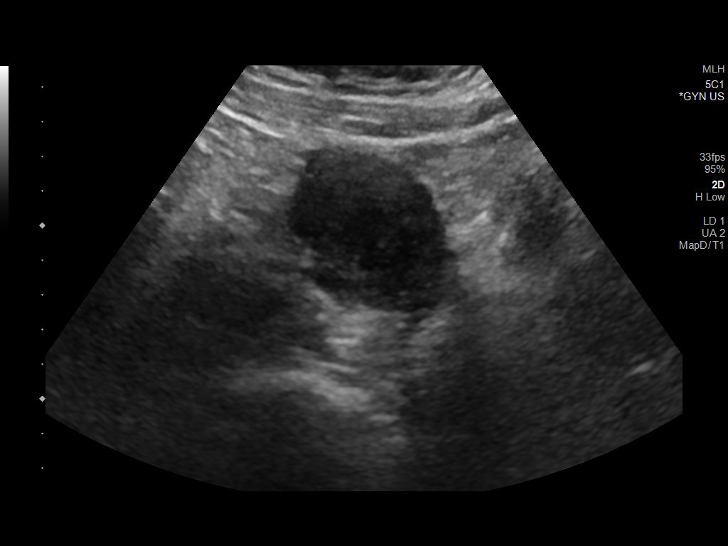
[im 13/73]
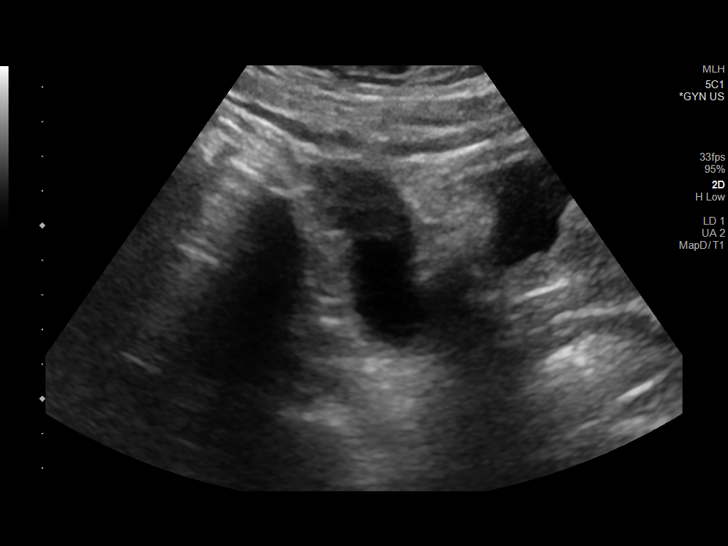
[im 19/73]
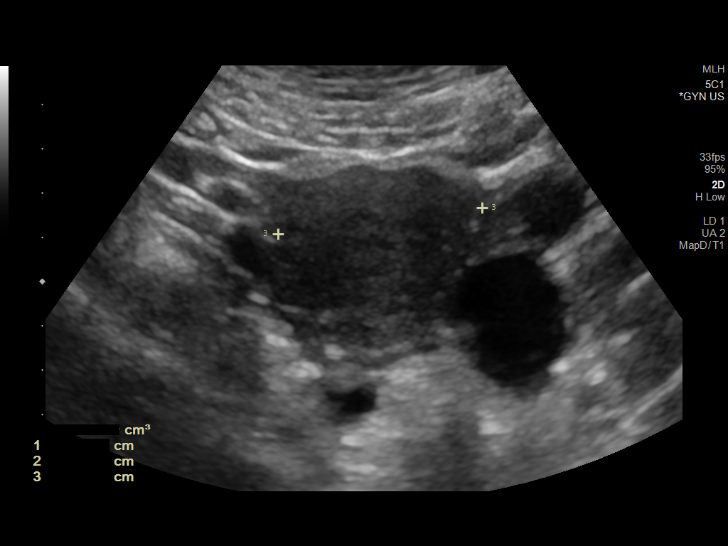
[im 25/73]
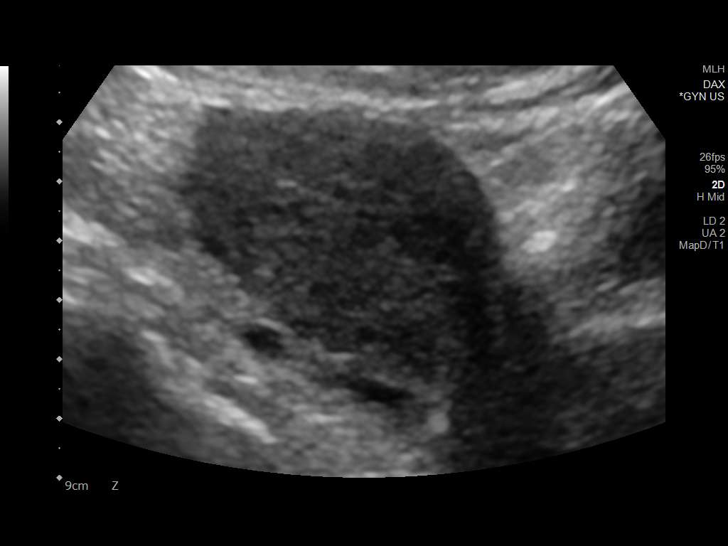
[im 31/73]
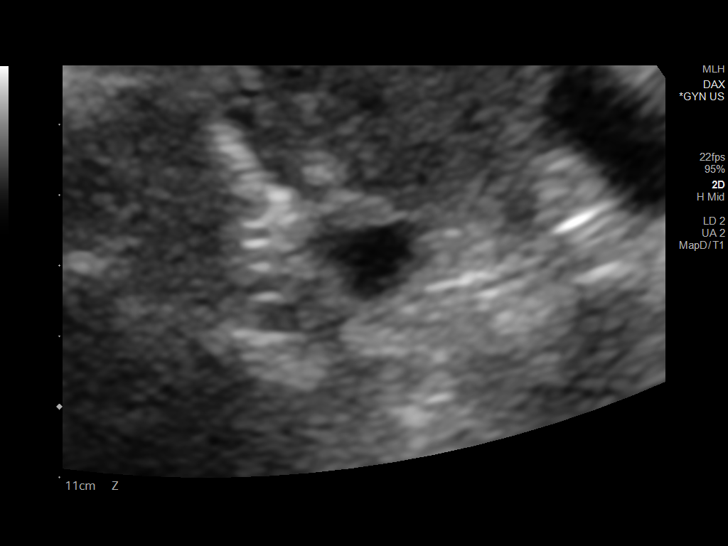
[im 37/73]
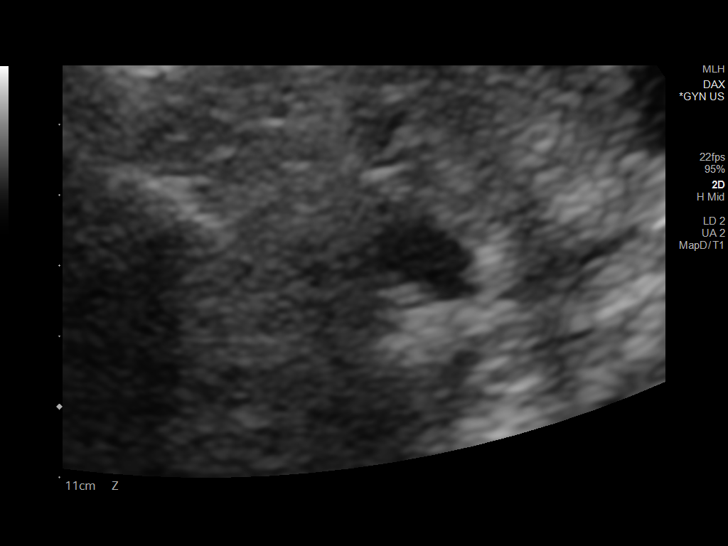
[im 43/73]
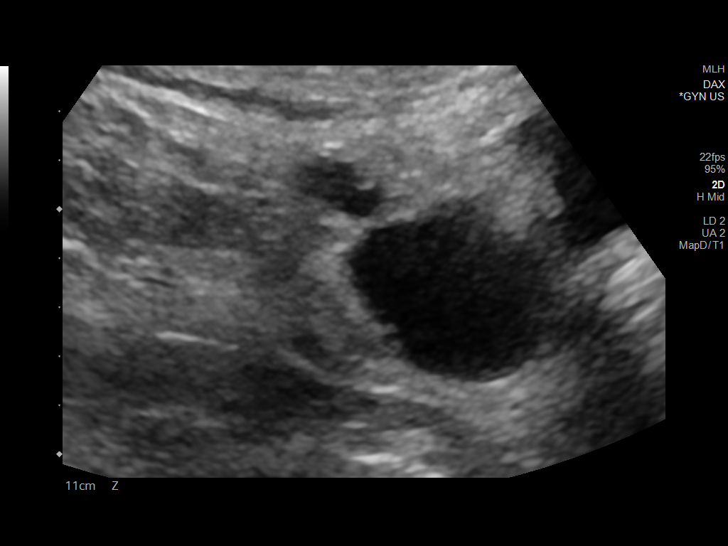
[im 49/73]
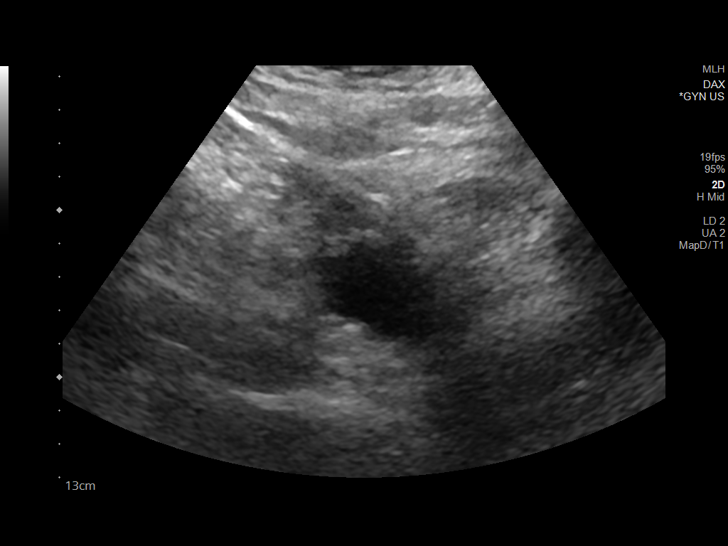
[im 55/73]
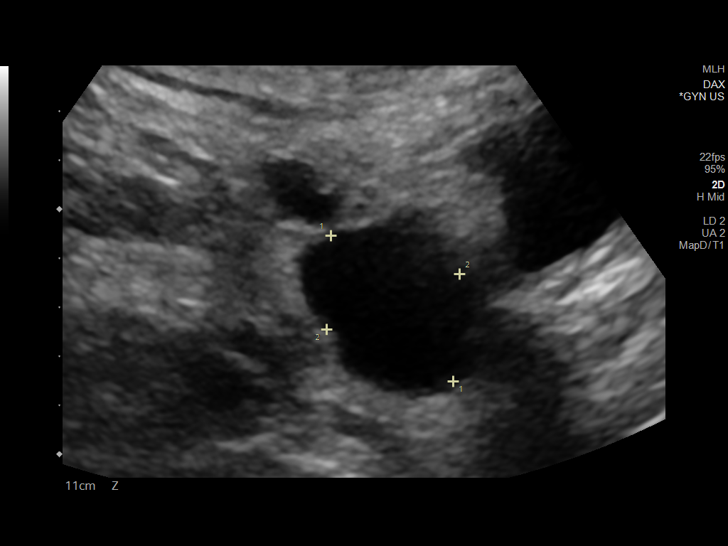
[im 61/73]
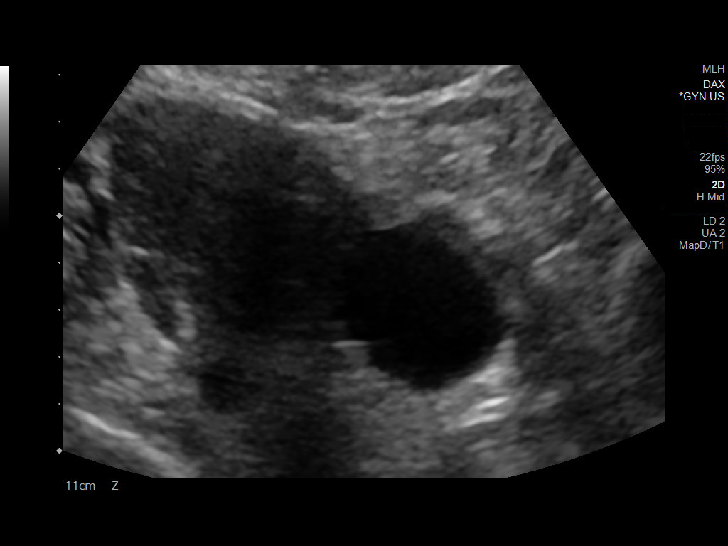
[im 67/73]
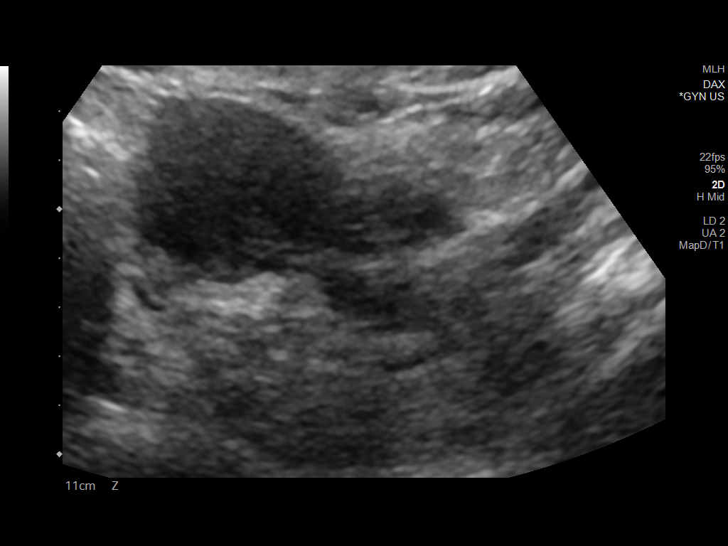
[im 73/73]
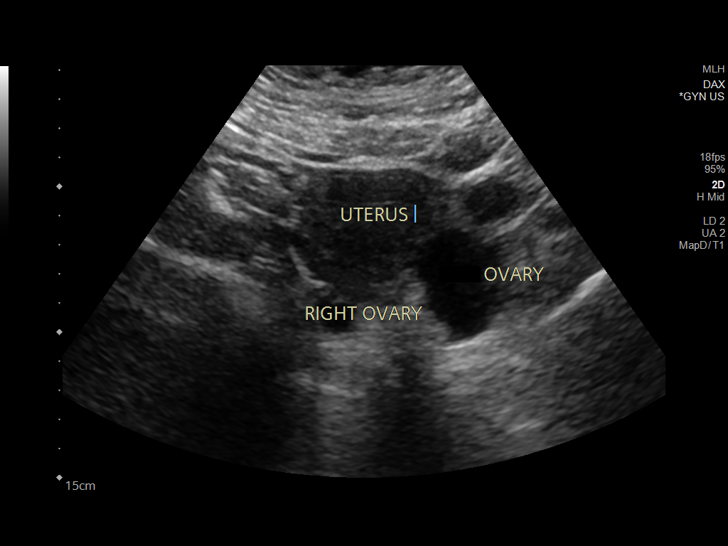

[13 of 25 positions shown; findings below may reference images not displayed]

FINDINGS: Uterus

Measurements: 7.8 x 4.1 x 4.7 cm = volume: 77 mL. No fibroids or
other mass visualized.

Endometrium

Thickness: 2 mm.  No focal abnormality visualized.

Right ovary

Measurements: 1.7 x 0.8 x 1.4 cm = volume: 2 mL. Normal
appearance/no adnexal mass.

Left ovary

Measurements: 6.2 x 3.9 x 3.8 cm = volume: 48 mL. The left ovary
contains a simple cyst measuring 4.2 x 2.9 x 3.6 cm, possibly
representing a follicular cyst in a patient of this age. The left
ovary demonstrates normal vascularity on color Doppler imaging.

Other findings:  No abnormal free fluid.
IMPRESSION: Interval development of a a 4.2 cm unilocular simple cyst within the
left ovary possibly representing a follicular cyst in a female
patient of this age. This has benign characteristics and is a common
finding in premenopausal females. No imaging follow up is required.
This follows consensus guidelines: Simple Adnexal Cysts: SRU
Consensus Conference Update on Follow-up and Reporting. Radiology

## 2022-10-08 ENCOUNTER — Ambulatory Visit (INDEPENDENT_AMBULATORY_CARE_PROVIDER_SITE_OTHER): Payer: Medicaid Other

## 2022-10-08 ENCOUNTER — Encounter (HOSPITAL_COMMUNITY): Payer: Self-pay

## 2022-10-08 ENCOUNTER — Ambulatory Visit (HOSPITAL_COMMUNITY)
Admission: EM | Admit: 2022-10-08 | Discharge: 2022-10-08 | Disposition: A | Discharge status: Home / Self Care | Payer: Medicaid Other | Attending: Nurse Practitioner | Admitting: Nurse Practitioner

## 2022-10-08 DIAGNOSIS — J4541 Moderate persistent asthma with (acute) exacerbation: Secondary | ICD-10-CM

## 2022-10-08 MED ORDER — PROMETHAZINE-DM 6.25-15 MG/5ML PO SYRP: 5.0000 mL | ORAL_SOLUTION | Freq: Every evening | ORAL | 0 refills | Status: AC | PRN

## 2022-10-08 MED ORDER — IPRATROPIUM-ALBUTEROL 0.5-2.5 (3) MG/3ML IN SOLN
3.0000 mL | Freq: Once | RESPIRATORY_TRACT | Status: AC
  Administered 2022-10-08: 3 mL via RESPIRATORY_TRACT

## 2022-10-08 MED ORDER — METHYLPREDNISOLONE ACETATE 40 MG/ML IJ SUSP
40.0000 mg | Freq: Once | INTRAMUSCULAR | Status: AC
  Administered 2022-10-08: 40 mg via INTRAMUSCULAR

## 2022-10-08 MED ORDER — BENZONATATE 100 MG PO CAPS: 100.0000 mg | ORAL_CAPSULE | Freq: Three times a day (TID) | ORAL | 0 refills | Status: AC | PRN

## 2022-10-08 MED ORDER — PREDNISONE 20 MG PO TABS: 40.0000 mg | ORAL_TABLET | Freq: Every day | ORAL | 0 refills | Status: AC

## 2022-10-08 MED ORDER — ALBUTEROL SULFATE HFA 108 (90 BASE) MCG/ACT IN AERS: 2.0000 | INHALATION_SPRAY | RESPIRATORY_TRACT | 0 refills | Status: AC | PRN

## 2022-10-08 MED ORDER — METHYLPREDNISOLONE ACETATE 40 MG/ML IJ SUSP
INTRAMUSCULAR | Status: AC
  Filled 2022-10-08: qty 1

## 2022-10-08 MED ORDER — IPRATROPIUM-ALBUTEROL 0.5-2.5 (3) MG/3ML IN SOLN
RESPIRATORY_TRACT | Status: AC
  Filled 2022-10-08: qty 3

## 2022-10-08 NOTE — ED Triage Notes (Signed)
Pt states chest cold for 2 days with sob and chest  pressure when she coughs and moves.

## 2022-10-08 NOTE — ED Provider Notes (Signed)
[facility]    CSN: [id number] Arrival date & time: 10/08/22  1319      History   Chief Complaint Chief Complaint  Patient presents with   Shortness of Breath    HPI Hannah Haney is a 32 y.o. female.   Patient presents today with 6-day history of congested cough with coughing up thick mucus, wheezing, chest tightness, runny and stuffy nose, sore throat, headache, vomiting from coughing so hard, diarrhea.  Also woke up this morning with severe rib cage pain with any movement or coughing.  Has been taking NyQuil, cough suppressant medication from sister, and Mucinex without relief.  Reports history of asthma, ran out of albuterol inhaler a few weeks ago.    Past Medical History:  Diagnosis Date   Asthma     Patient Active Problem List   Diagnosis Date Noted   Leukocytosis 12/09/2021   Thrombocytosis 12/09/2021    Past Surgical History:  Procedure Laterality Date   BACK SURGERY     LIPOMA EXCISION      OB History     Gravida  1   Para      Term      Preterm      AB      Living         SAB      IAB      Ectopic      Multiple      Live Births               Home Medications    Prior to Admission medications   Medication Sig Start Date End Date Taking? Authorizing Provider  benzonatate (TESSALON) 100 MG capsule Take 1 capsule (100 mg total) by mouth 3 (three) times daily as needed for cough. Do not take with alcohol or while driving or operating heavy machinery.  May cause drowsiness. 10/08/22  Yes Valentino Nose, NP  predniSONE (DELTASONE) 20 MG tablet Take 2 tablets (40 mg total) by mouth daily with breakfast for 5 days. 10/08/22 10/13/22 Yes Valentino Nose, NP  promethazine-dextromethorphan (PROMETHAZINE-DM) 6.25-15 MG/5ML syrup Take 5 mLs by mouth at bedtime as needed for cough. Do not take with alcohol or while driving or operating heavy machinery.  May cause drowsiness. 10/08/22  Yes Valentino Nose, NP  albuterol  (VENTOLIN HFA) 108 (90 Base) MCG/ACT inhaler Inhale 2 puffs into the lungs every 4 (four) hours as needed for wheezing or shortness of breath. 10/08/22   Valentino Nose, NP  lidocaine (LIDODERM) 5 % Place 1 patch onto the skin daily. Remove & Discard patch within 12 hours or as directed by MD 01/21/22   Small, Brooke L, PA  Vitamin D, Ergocalciferol, (DRISDOL) 1.25 MG (50000 UNIT) CAPS capsule Take 50,000 Units by mouth once a week. 12/18/21   [provider]  dicyclomine (BENTYL) 20 MG tablet Take 1 tablet (20 mg total) by mouth 2 (two) times daily. 06/15/18 05/19/19  Emi Holes, PA-C    Family History Family History  Problem Relation Age of Onset   Diabetes Mother    Cancer Father     Social History Social History   Tobacco Use   Smoking status: Every Day    Current packs/day: 0.50    Types: Cigarettes   Smokeless tobacco: Never  Vaping Use   Vaping status: Never Used  Substance Use Topics   Alcohol use: Yes    Comment: occ   Drug use: No  Allergies   Patient has no known allergies.   Review of Systems Review of Systems Per HPI  Physical Exam Triage Vital Signs ED Triage Vitals  Encounter Vitals Group     BP 10/08/22 1347 106/64     Systolic BP Percentile --      Diastolic BP Percentile --      Pulse Rate 10/08/22 1343 81     Resp 10/08/22 1343 16     Temp 10/08/22 1343 99.7 F (37.6 C)     Temp Source 10/08/22 1343 Oral     SpO2 10/08/22 1343 100 %     Weight --      Height --      Head Circumference --      Peak Flow --      Pain Score 10/08/22 1345 10     Pain Loc --      Pain Education --      Exclude from Growth Chart --    No data found.  Updated Vital Signs BP 106/64 (BP Location: Right Arm)   Pulse 81   Temp 99.7 F (37.6 C) (Oral)   Resp 16   LMP 10/05/2022   SpO2 100%   Visual Acuity Right Eye Distance:   Left Eye Distance:   Bilateral Distance:    Right Eye Near:   Left Eye Near:    Bilateral Near:      Physical Exam Vitals and nursing note reviewed.  Constitutional:      General: She is not in acute distress.    Appearance: Normal appearance. She is not ill-appearing or toxic-appearing.  HENT:     Head: Normocephalic and atraumatic.     Right Ear: Tympanic membrane, ear canal and external ear normal.     Left Ear: Tympanic membrane, ear canal and external ear normal.     Nose: Congestion and rhinorrhea present.     Mouth/Throat:     Mouth: Mucous membranes are moist.     Pharynx: Oropharynx is clear. Posterior oropharyngeal erythema present. No oropharyngeal exudate.  Eyes:     General: No scleral icterus.    Extraocular Movements: Extraocular movements intact.  Cardiovascular:     Rate and Rhythm: Normal rate and regular rhythm.  Pulmonary:     Effort: Pulmonary effort is normal. No tachypnea or respiratory distress.     Breath sounds: Decreased breath sounds and wheezing present. No rhonchi or rales.  Abdominal:     General: Abdomen is flat. Bowel sounds are normal. There is no distension.     Palpations: Abdomen is soft.  Musculoskeletal:     Cervical back: Normal range of motion and neck supple.  Lymphadenopathy:     Cervical: No cervical adenopathy.  Skin:    General: Skin is warm and dry.     Coloration: Skin is not jaundiced or pale.     Findings: No erythema or rash.  Neurological:     Mental Status: She is alert and oriented to person, place, and time.     Motor: No weakness.  Psychiatric:        Behavior: Behavior is cooperative.      UC Treatments / Results  Labs (all labs ordered are listed, but only abnormal results are displayed) Labs Reviewed - No data to display  EKG   Radiology DG Chest 2 View  Result Date: 10/08/2022 CLINICAL DATA:  Cough for 1 week.  Ribcage pain.  Chest pressure. EXAM: CHEST - 2 VIEW COMPARISON:  Cardiac  radiographs 01/21/2022 FINDINGS: Cardiac silhouette and mediastinal contours are within normal limits. The lungs are  clear. No pleural effusion or pneumothorax. No acute skeletal abnormality. IMPRESSION: No active cardiopulmonary disease. Electronically Signed   By: Neita Garnet M.D.   On: 10/08/2022 15:47    Procedures Procedures (including critical care time)  Medications Ordered in UC Medications  ipratropium-albuterol (DUONEB) 0.5-2.5 (3) MG/3ML nebulizer solution 3 mL (3 mLs Nebulization Given 10/08/22 1454)  methylPREDNISolone acetate (DEPO-MEDROL) injection 40 mg (40 mg Intramuscular Given 10/08/22 1452)    Initial Impression / Assessment and Plan / UC Course  I have reviewed the triage vital signs and the nursing notes.  Pertinent labs & imaging results that were available during my care of the patient were reviewed by me and considered in my medical decision making (see chart for details).   Patient is well-appearing, normotensive, afebrile, not tachycardic, not tachypneic, oxygenating well on room air.    1. Moderate persistent asthma with acute exacerbation Vitals and exam are overall reassuring DuoNeb and Depo-Medrol given with improvement in lung aeration bilaterally; expiratory wheezes remained Will start patient on albuterol inhaler at home every 4-6 hours as needed for wheezing/shortness of breath, oral prednisone starting tomorrow morning Start cough suppressant medication and prescription sent to pharmacy Chest x-ray not officially read by radiologist prior to patient's discharge, however reviewed by myself and B. Hagler, MD and no acute findings seen Will contact patient if we need to change plan of care Strict ER precautions discussed with patient Work and school excuse given today  The patient was given the opportunity to ask questions.  All questions answered to their satisfaction.  The patient is in agreement to this plan.    Final Clinical Impressions(s) / UC Diagnoses   Final diagnoses:  Moderate persistent asthma with acute exacerbation     Discharge Instructions       As we discussed, no acute abnormalities were seen on the chest x-ray today.  We gave you a breathing treatment and shot of steroid medication to help with your breathing today.  Please start taking the oral prednisone tomorrow morning.  Continue albuterol inhaler every 4-6 hours as needed for wheezing or shortness of breath.  You can take the cough suppressant medication as needed for dry cough.  Also be sure you are drinking plenty of fluids, start Mucinex 600 mg twice daily, and you can start nasal saline rinses or running a humidifier to help with congestion.  Take Tylenol or ibuprofen as needed for pain.     ED Prescriptions     Medication Sig Dispense Auth. Provider   albuterol (VENTOLIN HFA) 108 (90 Base) MCG/ACT inhaler Inhale 2 puffs into the lungs every 4 (four) hours as needed for wheezing or shortness of breath. 1 each Valentino Nose, NP   predniSONE (DELTASONE) 20 MG tablet Take 2 tablets (40 mg total) by mouth daily with breakfast for 5 days. 10 tablet Cathlean Marseilles A, NP   promethazine-dextromethorphan (PROMETHAZINE-DM) 6.25-15 MG/5ML syrup Take 5 mLs by mouth at bedtime as needed for cough. Do not take with alcohol or while driving or operating heavy machinery.  May cause drowsiness. 118 mL Cathlean Marseilles A, NP   benzonatate (TESSALON) 100 MG capsule Take 1 capsule (100 mg total) by mouth 3 (three) times daily as needed for cough. Do not take with alcohol or while driving or operating heavy machinery.  May cause drowsiness. 21 capsule Valentino Nose, NP      PDMP not  reviewed this encounter.   Valentino Nose, NP 10/08/22 1757

## 2022-10-08 NOTE — Discharge Instructions (Signed)
As we discussed, no acute abnormalities were seen on the chest x-ray today.  We gave you a breathing treatment and shot of steroid medication to help with your breathing today.  Please start taking the oral prednisone tomorrow morning.  Continue albuterol inhaler every 4-6 hours as needed for wheezing or shortness of breath.  You can take the cough suppressant medication as needed for dry cough.  Also be sure you are drinking plenty of fluids, start Mucinex 600 mg twice daily, and you can start nasal saline rinses or running a humidifier to help with congestion.  Take Tylenol or ibuprofen as needed for pain.

## 2022-10-31 ENCOUNTER — Emergency Department (HOSPITAL_BASED_OUTPATIENT_CLINIC_OR_DEPARTMENT_OTHER)
Admission: EM | Admit: 2022-10-31 | Discharge: 2022-10-31 | Disposition: A | Discharge status: Home / Self Care | Payer: Medicaid Other | Attending: Emergency Medicine | Admitting: Emergency Medicine

## 2022-10-31 ENCOUNTER — Other Ambulatory Visit: Payer: Self-pay

## 2022-10-31 ENCOUNTER — Encounter (HOSPITAL_BASED_OUTPATIENT_CLINIC_OR_DEPARTMENT_OTHER): Payer: Self-pay | Admitting: Pediatrics

## 2022-10-31 ENCOUNTER — Emergency Department (HOSPITAL_BASED_OUTPATIENT_CLINIC_OR_DEPARTMENT_OTHER): Payer: Medicaid Other

## 2022-10-31 DIAGNOSIS — Y92410 Unspecified street and highway as the place of occurrence of the external cause: Secondary | ICD-10-CM | POA: Diagnosis not present

## 2022-10-31 DIAGNOSIS — M25511 Pain in right shoulder: Secondary | ICD-10-CM | POA: Diagnosis present

## 2022-10-31 DIAGNOSIS — M79644 Pain in right finger(s): Secondary | ICD-10-CM | POA: Insufficient documentation

## 2022-10-31 MED ORDER — IBUPROFEN 800 MG PO TABS
800.0000 mg | ORAL_TABLET | Freq: Once | ORAL | Status: AC
  Administered 2022-10-31: 800 mg via ORAL
  Filled 2022-10-31: qty 1

## 2022-10-31 MED ORDER — CYCLOBENZAPRINE HCL 10 MG PO TABS: 5.0000 mg | ORAL_TABLET | Freq: Every day | ORAL | 0 refills | Status: AC
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
## 2023-01-12 ENCOUNTER — Telehealth: Payer: Self-pay | Admitting: Physician Assistant

## 2023-01-12 NOTE — Telephone Encounter (Signed)
Called pt to reschedule appt for Hannah Haney will be out of office. Please reschedule appt for 1st available appt

## 2023-01-13 ENCOUNTER — Ambulatory Visit: Payer: Medicaid Other | Admitting: Physician Assistant

## 2023-01-27 ENCOUNTER — Ambulatory Visit: Payer: Medicaid Other | Admitting: Physician Assistant

## 2023-02-14 ENCOUNTER — Emergency Department (HOSPITAL_BASED_OUTPATIENT_CLINIC_OR_DEPARTMENT_OTHER)
Admission: EM | Admit: 2023-02-14 | Discharge: 2023-02-14 | Disposition: A | Discharge status: Home / Self Care | Payer: Medicaid Other | Attending: Emergency Medicine | Admitting: Emergency Medicine

## 2023-02-14 ENCOUNTER — Encounter (HOSPITAL_BASED_OUTPATIENT_CLINIC_OR_DEPARTMENT_OTHER): Payer: Self-pay | Admitting: Emergency Medicine

## 2023-02-14 ENCOUNTER — Emergency Department (HOSPITAL_BASED_OUTPATIENT_CLINIC_OR_DEPARTMENT_OTHER): Payer: Medicaid Other

## 2023-02-14 ENCOUNTER — Other Ambulatory Visit: Payer: Self-pay

## 2023-02-14 DIAGNOSIS — M79642 Pain in left hand: Secondary | ICD-10-CM | POA: Diagnosis present

## 2023-02-14 DIAGNOSIS — J45909 Unspecified asthma, uncomplicated: Secondary | ICD-10-CM | POA: Diagnosis not present

## 2023-02-14 MED ORDER — IBUPROFEN 600 MG PO TABS: 600.0000 mg | ORAL_TABLET | Freq: Four times a day (QID) | ORAL | 0 refills | Status: AC | PRN

## 2023-02-14 NOTE — Discharge Instructions (Addendum)
As discussed, x-ray negative for any fracture or dislocation.  Will recommend rest, ice, elevation of arm above level of your heart help with swelling as well as taking medicine in the form of anti-inflammatories.  Will send some into your pharmacy.  Wear brace as needed.  Please do not hesitate to return to emergency department if the worrisome signs and symptoms we discussed become apparent.

## 2023-02-14 NOTE — ED Triage Notes (Signed)
Pt c/o LT hand pain s/p catching herself from falling on Thurs; +edema

## 2023-02-14 NOTE — ED Provider Notes (Signed)
[place] [facility] AT [facility] Provider Note   CSN: [id number] Arrival date & time: 02/14/23  1204     History {Add pertinent medical, surgical, social history, OB history to HPI:1} Chief Complaint  Patient presents with   Hand Pain    Hannah Haney is a 33 y.o. female.   Hand Pain   33 year old female presents emergency department with complaints of left-sided hand pain.  Patient states that she was falling and caught herself with her left hand and has had pain since then.  Incident occurred on Thursday.  Has been taking them with improvement of symptoms.  Denies any weakness or sensory deficits left hand.  Denies trauma elsewhere.  Past medical history significant for asthma  Home Medications Prior to Admission medications   Medication Sig Start Date End Date Taking? Authorizing Provider  ibuprofen (ADVIL) 600 MG tablet Take 1 tablet (600 mg total) by mouth every 6 (six) hours as needed. 02/14/23  Yes Sherian Maroon A, PA  albuterol (VENTOLIN HFA) 108 (90 Base) MCG/ACT inhaler Inhale 2 puffs into the lungs every 4 (four) hours as needed for wheezing or shortness of breath. 10/08/22   Valentino Nose, NP  benzonatate (TESSALON) 100 MG capsule Take 1 capsule (100 mg total) by mouth 3 (three) times daily as needed for cough. Do not take with alcohol or while driving or operating heavy machinery.  May cause drowsiness. 10/08/22   Valentino Nose, NP  lidocaine (LIDODERM) 5 % Place 1 patch onto the skin daily. Remove & Discard patch within 12 hours or as directed by MD 01/21/22   Small, Brooke L, PA  promethazine-dextromethorphan (PROMETHAZINE-DM) 6.25-15 MG/5ML syrup Take 5 mLs by mouth at bedtime as needed for cough. Do not take with alcohol or while driving or operating heavy machinery.  May cause drowsiness. 10/08/22   Valentino Nose, NP  Vitamin D, Ergocalciferol, (DRISDOL) 1.25 MG (50000 UNIT) CAPS capsule Take 50,000 Units by mouth once a  week. 12/18/21   [provider]  dicyclomine (BENTYL) 20 MG tablet Take 1 tablet (20 mg total) by mouth 2 (two) times daily. 06/15/18 05/19/19  Emi Holes, PA-C      Allergies    Patient has no known allergies.    Review of Systems   Review of Systems  All other systems reviewed and are negative.   Physical Exam Updated Vital Signs BP 103/63 (BP Location: Right Arm)   Pulse 84   Temp 97.7 F (36.5 C)   Resp 18   Ht 5\' 3"  (1.6 m)   Wt 116.6 kg   LMP 02/10/2023   SpO2 99%   BMI 45.53 kg/m  Physical Exam Vitals and nursing note reviewed.  Constitutional:      General: She is not in acute distress.    Appearance: She is well-developed.  HENT:     Head: Normocephalic and atraumatic.  Eyes:     Conjunctiva/sclera: Conjunctivae normal.  Cardiovascular:     Rate and Rhythm: Normal rate and regular rhythm.     Heart sounds: No murmur heard. Pulmonary:     Effort: Pulmonary effort is normal. No respiratory distress.     Breath sounds: Normal breath sounds.  Abdominal:     Palpations: Abdomen is soft.     Tenderness: There is no abdominal tenderness.  Musculoskeletal:        General: No swelling.     Cervical back: Neck supple.  Skin:    General: Skin is  warm and dry.     Capillary Refill: Capillary refill takes less than 2 seconds.     Comments: Swelling as well as tenderness distal 3 through fourth metacarpals left hand.  Full range of motion digits left hand as well as wrist.  Radial pulses 2+ bilaterally.  Cap refill less than 2 seconds.  swelling dorsal lateral aspect of left hand.  No intact sensation distally.  Neurological:     Mental Status: She is alert.  Psychiatric:        Mood and Affect: Mood normal.     ED Results / Procedures / Treatments   Labs (all labs ordered are listed, but only abnormal results are displayed) Labs Reviewed - No data to display  EKG None  Radiology DG Hand Complete Left Result Date: 02/14/2023 CLINICAL DATA:   Left hand pain and swelling after injury 2 days ago. EXAM: LEFT HAND - COMPLETE 3+ VIEW COMPARISON:  None Available. FINDINGS: There is no evidence of fracture or dislocation. There is no evidence of arthropathy or other focal bone abnormality. Soft tissues are unremarkable. IMPRESSION: Negative. Electronically Signed   By: Elberta Fortis M.D.   On: 02/14/2023 13:17    Procedures Procedures  {Document cardiac monitor, telemetry assessment procedure when appropriate:1}  Medications Ordered in ED Medications - No data to display  ED Course/ Medical Decision Making/ A&P   {   Click here for ABCD2, HEART and other calculatorsREFRESH Note before signing :1}                              Medical Decision Making Amount and/or Complexity of Data Reviewed Radiology: ordered.  Risk Prescription drug management.   This patient presents to the ED for concern of hand pain, this involves an extensive number of treatment options, and is a complaint that carries with it a high risk of complications and morbidity.  The differential diagnosis includes fracture, strain/pain, dislocation limitus/tendinous injury, neurovasc compromise, cellulitis, erysipelas, negative infection, septic arthritis, other   Co morbidities that complicate the patient evaluation  See HPI   Additional history obtained:  Additional history obtained from EMR External records from outside source obtained and reviewed including hospital records   Lab Tests:  N/s   Imaging Studies ordered:  I ordered imaging studies including left hand x-ray I independently visualized and interpreted imaging which showed no acute abnormality I agree with the radiologist interpretation   Cardiac Monitoring: / EKG:  The patient was maintained on a cardiac monitor.  I personally viewed and interpreted the cardiac monitored which showed an underlying rhythm of: Sinus   Consultations Obtained:  N/a  Problem List / ED Course /  Critical interventions / Medication management  Left hand pain Reevaluation of the patient showed that the patient stayed the same I have reviewed the patients home medicines and have made adjustments as needed   Social Determinants of Health:  Chronic cigarette use.  Denies illicit drug use.   Test / Admission - Considered:  Left hand pain Vitals signs within normal range and stable throughout visit. Imaging studies significant for: See above *** Worrisome signs and symptoms were discussed with the patient, and the patient acknowledged understanding to return to the ED if noticed. Patient was stable upon discharge. \  {Document critical care time when appropriate:1} {Document review of labs and clinical decision tools ie heart score, Chads2Vasc2 etc:1}  {Document your independent review of radiology images, and any  outside records:1} {Document your discussion with family members, caretakers, and with consultants:1} {Document social determinants of health affecting pt's care:1} {Document your decision making why or why not admission, treatments were needed:1} Final Clinical Impression(s) / ED Diagnoses Final diagnoses:  Left hand pain    Rx / DC Orders ED Discharge Orders          Ordered    ibuprofen (ADVIL) 600 MG tablet  Every 6 hours PRN        02/14/23 1520

## 2023-03-02 ENCOUNTER — Inpatient Hospital Stay
Payer: No Typology Code available for payment source | Attending: Hematology and Oncology | Admitting: Hematology and Oncology

## 2023-03-02 ENCOUNTER — Inpatient Hospital Stay: Payer: No Typology Code available for payment source

## 2023-03-17 ENCOUNTER — Telehealth: Payer: Self-pay | Admitting: Physician Assistant

## 2023-03-17 NOTE — Telephone Encounter (Signed)
 Scheduled appointments per referral. Patient is aware of the appointment time and date as well as the address. Patient was informed to arrive 10-15 minutes prior with updated insurance information. All questions were answered.

## 2023-04-01 NOTE — Progress Notes (Addendum)
 [facility] Telephone:([phone]) [phone]   [phone]  CONSULT NOTE  REFERRING PHYSICIAN: Aurelio Brash NP  REASON FOR CONSULTATION:  Leukocytosis  HPI Evon Dejarnett is a 33 y.o. female with no significant past medical history except for vitamin D deficiency, obesity, and chronic pain is referred to the clinic for leukocytosis.   She had an appointment with her PCP in January 2024  for an annual visit. She is scheduled for her next annual visit later this month. She had labs performed on 01/07/2023 which showed sed rate elevated at 48. Her CRP was normal. Her CMP was unremarkable. Her CBC showed elevated WBC of 17.4, normal Hbg of 13.3, and normal plt of 442. Her absolute neutrophil count was elevated at 11.9 and absolutely lymphocytes slightly elevated at 4.0. HIV came back negative. In 04-24-2020, her RPR was reactive with a titer of 1:2. Her T Pallidum Abs came back non-reactive. She is unsure why she had a false positive on this test. When comparing her CBC to January 2024, her WBC was 13.6 at that time with elevated platelets at 499 and neutrophils at 8.9. Therefore, due to the persistently elevated WBC, she was referred to the clinic for further evaluation.   The patient states she has had leukocytosis since 2006/04/25. She was seen one time in [place] by a hematologist for this. She was supposed to be seen every 6 months, but she moved to [facility] and was unable to follow-up. She states they thought her leukocytosis was reactive due to smoking.   Overall, the patient is feeling well today.  She denies any recent illnesses.  She denies any fever, chills, night sweats, or lymphadenopathy.  She denies any other recent nasal congestion, allergies, sore throat, cough, skin infections, dysuria, skin rash or diarrhea. She denies any arthritis, unexplained weight loss, and bleeding or bruising. She occasionally has cramping abdominal pain which correlates with her menstrual  cycles. She uses an albuterol inhaler as needed, but this is very rare and typically more in the winter months. She denies any steroid or hormone use. She denies any drug, environmental, or food allergies. She had back surgery 2 years ago. She denies any other recent surgeries.  She has smoked since she was 33 years old. She states on pack usually lasts her 3 days. She socially drinks about 2 times per month. She denies any illicit drug use. She is single with no children. She works as a Lawyer at a nursing home.    Both her parents have passed away. Her mom died from diabetes complications. Her dad died in 04/24/2016 from lung cancer. He was treated in [place].    She states she was checked for polycystic ovarian syndrome several years ago and this came back negative.  HPI  Past Medical History:  Diagnosis Date   Asthma     Past Surgical History:  Procedure Laterality Date   BACK SURGERY     LIPOMA EXCISION      Family History  Problem Relation Age of Onset   Diabetes Mother    Cancer Father     Social History Social History   Tobacco Use   Smoking status: Every Day    Current packs/day: 0.50    Types: Cigarettes   Smokeless tobacco: Never  Vaping Use   Vaping status: Never Used  Substance Use Topics   Alcohol use: Yes    Comment: occ   Drug use: No    No Known Allergies  Current  Outpatient Medications  Medication Sig Dispense Refill   albuterol (VENTOLIN HFA) 108 (90 Base) MCG/ACT inhaler Inhale 2 puffs into the lungs every 4 (four) hours as needed for wheezing or shortness of breath. 1 each 0   benzonatate (TESSALON) 100 MG capsule Take 1 capsule (100 mg total) by mouth 3 (three) times daily as needed for cough. Do not take with alcohol or while driving or operating heavy machinery.  May cause drowsiness. 21 capsule 0   ibuprofen (ADVIL) 600 MG tablet Take 1 tablet (600 mg total) by mouth every 6 (six) hours as needed. 30 tablet 0   lidocaine (LIDODERM) 5 % Place 1  patch onto the skin daily. Remove & Discard patch within 12 hours or as directed by MD 30 patch 0   promethazine-dextromethorphan (PROMETHAZINE-DM) 6.25-15 MG/5ML syrup Take 5 mLs by mouth at bedtime as needed for cough. Do not take with alcohol or while driving or operating heavy machinery.  May cause drowsiness. 118 mL 0   Vitamin D, Ergocalciferol, (DRISDOL) 1.25 MG (50000 UNIT) CAPS capsule Take 50,000 Units by mouth once a week.     No current facility-administered medications for this visit.    REVIEW OF SYSTEMS:   Review of Systems  Constitutional: Negative for appetite change, chills, fatigue, fever and unexpected weight change.  HENT: Negative for mouth sores, nosebleeds, sore throat and trouble swallowing.   Eyes: Negative for eye problems and icterus.  Respiratory: Negative for cough, hemoptysis, shortness of breath and wheezing.   Cardiovascular: Negative for chest pain and leg swelling.  Gastrointestinal: Negative for abdominal pain, constipation, diarrhea, nausea and vomiting.  Genitourinary: Negative for bladder incontinence, difficulty urinating, dysuria, frequency and hematuria.   Musculoskeletal: Negative for back pain, gait problem, neck pain and neck stiffness.  Skin: Negative for itching and rash.  Neurological: Negative for dizziness, extremity weakness, gait problem, headaches, light-headedness and seizures.  Hematological: Negative for adenopathy. Does not bruise/bleed easily.  Psychiatric/Behavioral: Negative for confusion, depression and sleep disturbance. The patient is not nervous/anxious.     PHYSICAL EXAMINATION:  There were no vitals taken for this visit.  ECOG PERFORMANCE STATUS: 1 - Symptomatic but completely ambulatory  Physical Exam  Constitutional: Oriented to person, place, and time and well-developed, well-nourished, and in no distress.  HENT:  Head: Normocephalic and atraumatic.  Mouth/Throat: Oropharynx is clear and moist. No oropharyngeal  exudate.  Eyes: Conjunctivae are normal. Right eye exhibits no discharge. Left eye exhibits no discharge. No scleral icterus.  Neck: Normal range of motion. Neck supple.  Cardiovascular: Normal rate, regular rhythm, normal heart sounds and intact distal pulses.   Pulmonary/Chest: Effort normal and breath sounds normal. No respiratory distress. No wheezes. No rales.  Abdominal: Soft. Bowel sounds are normal. Exhibits no distension and no mass. There is no tenderness.  Musculoskeletal: Normal range of motion. Exhibits no edema.  Lymphadenopathy:    No cervical adenopathy.  Neurological: Alert and oriented to person, place, and time. Exhibits normal muscle tone. Gait normal. Coordination normal.  Skin: Skin is warm and dry. No rash noted. Not diaphoretic. No erythema. No pallor.  Psychiatric: Mood, memory and judgment normal.  Vitals reviewed.  LABORATORY DATA: Lab Results  Component Value Date   WBC 10.6 (H) 01/21/2022   HGB 15.1 (H) 01/21/2022   HCT 46.4 (H) 01/21/2022   MCV 78.6 (L) 01/21/2022   PLT 397 01/21/2022      Chemistry      Component Value Date/Time   NA 135 01/21/2022 1528  K 3.9 01/21/2022 1528   CL 105 01/21/2022 1528   CO2 21 (L) 01/21/2022 1528   BUN 15 01/21/2022 1528   CREATININE 0.91 01/21/2022 1528      Component Value Date/Time   CALCIUM 8.7 (L) 01/21/2022 1528   ALKPHOS 82 08/14/2019 0148   AST 18 08/14/2019 0148   ALT 23 08/14/2019 0148   BILITOT 0.4 08/14/2019 0148       RADIOGRAPHIC STUDIES: No results found.  ASSESSMENT: This is a very pleasant 33 year-old [place] American female referred to clinic for leukocytosis.   The patient had several lab studies today including a CBC, CMP, LDH, iron, ferritin, flow cytometry, JAK2, and BCR/ABL.    The patient's CBC today shows persistent elevated white blood cell count at 15.6 with an absolute lymphocyte count of 3.6. Her absolute neutrophil count is  elevated at 11.0. Her hemoglobin is also normal  at 13.3.  Her CMP is unremarkable. Her platelet count is elevated at 461k. His LDH is within normal limits.  Her other lab results are pending.  The patient was seen with Dr. Arbutus Ped today.  Dr. Arbutus Ped discussed that we will wait for the results of her flow cytometry, JAK2, and BCR/ABL before determining next steps. We also ordered iron studies and ferritin due to the thrombocytosis. However, it is likely her leukocytosis is reactive, possibly secondary to obesity and smoking.   We will see her back in 1 month to review the results of her blood work from today.   She was strongly encouraged to quit smoking.   The patient voices understanding of current disease status and treatment options and is in agreement with the current care plan.  All questions were answered. The patient knows to call the clinic with any problems, questions or concerns. We can certainly see the patient much sooner if necessary.  Thank you so much for allowing me to participate in the care of [facility]. I will continue to follow up the patient with you and assist in her care.  Disclaimer: This note was dictated with voice recognition software. Similar sounding words can inadvertently be transcribed and may not be corrected upon review.  Jaylan Hinojosa L Adda Stokes April 01, 2023, 12:29 PM  ADDENDUM: Hematology/Oncology Attending: I had a face-to-face encounter with the patient today.  I reviewed her records, lab and recommended her care plan.  This is a very pleasant 33 years old African-American female presented for evaluation of leukocytosis that has been going on for several years since at least 2008 when she was evaluated by a hematologist in [place] and it was felt to be reactive leukocytosis secondary to smoking.  The patient was seen recently by her primary care provider in January 2024 and repeat CBC showed elevated white blood count of 17.4 with normal hemoglobin of 13.3 and platelets of 442,000.  Her  absolute neutrophil count at that time was 8 11,900.  The patient had similar results but has been going on for several years.  She is a current smoker.  She denied having any recent steroids or hormonal therapy.  She has no bleeding, bruises or ecchymosis. I had a lengthy discussion with the patient today about her condition.  We ordered several studies today including repeat CBC that showed total white blood count of 15.6 with hemoglobin of 13.3 and hematocrit 41.8% and platelets count were elevated at 461,000.  She has normal comprehensive metabolic panel as well as LDH. I explained to the patient that this is likely reactive  leukocytosis secondary to smoking.  She denied having any recurrent infection. We will also rule out any possible underlying myeloproliferative disorder by ordering a JAK2 mutation panel in addition to BCR/ABL FISH study.  If these studies are negative, then the patient can follow-up with her primary care physician with close monitoring and no need for extensive hematological evaluation. We will see her back for follow-up visit in 1 months for evaluation and repeat CBC and also for discussion of the pending lab results. She was advised to call immediately if she has any concerning symptoms in the interval. I also strongly encouraged the patient to quit smoking. The total time spent in the appointment was 60 minutes.  Disclaimer: This note was dictated with voice recognition software. Similar sounding words can inadvertently be transcribed and may be missed upon review.  Lajuana Matte, MD

## 2023-04-03 ENCOUNTER — Other Ambulatory Visit: Payer: Medicaid Other

## 2023-04-03 ENCOUNTER — Other Ambulatory Visit: Payer: Self-pay | Admitting: Physician Assistant

## 2023-04-03 ENCOUNTER — Encounter: Payer: Medicaid Other | Admitting: Physician Assistant

## 2023-04-03 DIAGNOSIS — D72825 Bandemia: Secondary | ICD-10-CM

## 2023-04-03 DIAGNOSIS — D75839 Thrombocytosis, unspecified: Secondary | ICD-10-CM

## 2023-04-06 ENCOUNTER — Inpatient Hospital Stay (HOSPITAL_BASED_OUTPATIENT_CLINIC_OR_DEPARTMENT_OTHER): Payer: Medicaid Other | Admitting: Physician Assistant

## 2023-04-06 ENCOUNTER — Other Ambulatory Visit: Payer: Self-pay

## 2023-04-06 ENCOUNTER — Inpatient Hospital Stay: Payer: Medicaid Other | Attending: Hematology and Oncology

## 2023-04-06 VITALS — BP 119/60 | HR 74 | Temp 98.0°F | Resp 13 | Wt 259.2 lb

## 2023-04-06 DIAGNOSIS — F17210 Nicotine dependence, cigarettes, uncomplicated: Secondary | ICD-10-CM | POA: Insufficient documentation

## 2023-04-06 DIAGNOSIS — D72825 Bandemia: Secondary | ICD-10-CM

## 2023-04-06 DIAGNOSIS — D75839 Thrombocytosis, unspecified: Secondary | ICD-10-CM

## 2023-04-06 DIAGNOSIS — E669 Obesity, unspecified: Secondary | ICD-10-CM | POA: Insufficient documentation

## 2023-04-06 LAB — CBC WITH DIFFERENTIAL (CANCER CENTER ONLY)
Abs Immature Granulocytes: 0.06 10*3/uL (ref 0.00–0.07)
Basophils Absolute: 0 10*3/uL (ref 0.0–0.1)
Basophils Relative: 0 %
Eosinophils Absolute: 0.1 10*3/uL (ref 0.0–0.5)
Eosinophils Relative: 1 %
HCT: 41.8 % (ref 36.0–46.0)
Hemoglobin: 13.3 g/dL (ref 12.0–15.0)
Immature Granulocytes: 0 %
Lymphocytes Relative: 23 %
Lymphs Abs: 3.6 10*3/uL (ref 0.7–4.0)
MCH: 25.6 pg — ABNORMAL LOW (ref 26.0–34.0)
MCHC: 31.8 g/dL (ref 30.0–36.0)
MCV: 80.5 fL (ref 80.0–100.0)
Monocytes Absolute: 0.8 10*3/uL (ref 0.1–1.0)
Monocytes Relative: 5 %
Neutro Abs: 11 10*3/uL — ABNORMAL HIGH (ref 1.7–7.7)
Neutrophils Relative %: 71 %
Platelet Count: 461 10*3/uL — ABNORMAL HIGH (ref 150–400)
RBC: 5.19 MIL/uL — ABNORMAL HIGH (ref 3.87–5.11)
RDW: 14.6 % (ref 11.5–15.5)
WBC Count: 15.6 10*3/uL — ABNORMAL HIGH (ref 4.0–10.5)
nRBC: 0 % (ref 0.0–0.2)

## 2023-04-06 LAB — CMP (CANCER CENTER ONLY)
ALT: 19 U/L (ref 0–44)
AST: 16 U/L (ref 15–41)
Albumin: 4.1 g/dL (ref 3.5–5.0)
Alkaline Phosphatase: 81 U/L (ref 38–126)
Anion gap: 7 (ref 5–15)
BUN: 13 mg/dL (ref 6–20)
CO2: 26 mmol/L (ref 22–32)
Calcium: 8.9 mg/dL (ref 8.9–10.3)
Chloride: 104 mmol/L (ref 98–111)
Creatinine: 0.69 mg/dL (ref 0.44–1.00)
GFR, Estimated: >60 mL/min (ref >60)
Glucose, Bld: 78 mg/dL (ref 70–99)
Potassium: 3.9 mmol/L (ref 3.5–5.1)
Sodium: 137 mmol/L (ref 135–145)
Total Bilirubin: 0.6 mg/dL (ref 0.0–1.2)
Total Protein: 7.6 g/dL (ref 6.5–8.1)

## 2023-04-06 LAB — LACTATE DEHYDROGENASE: LDH: 122 U/L (ref 98–192)

## 2023-04-06 LAB — IRON AND IRON BINDING CAPACITY (CC-WL,HP ONLY)
Iron: 157 ug/dL (ref 28–170)
Saturation Ratios: 40 % — ABNORMAL HIGH (ref 10.4–31.8)
TIBC: 395 ug/dL (ref 250–450)
UIBC: 238 ug/dL (ref 148–442)

## 2023-04-06 LAB — FERRITIN: Ferritin: 10 ng/mL — ABNORMAL LOW (ref 11–307)

## 2023-04-07 LAB — FLOW CYTOMETRY

## 2023-04-07 LAB — SURGICAL PATHOLOGY

## 2023-04-09 LAB — BCR-ABL1 FISH
Cells Analyzed: 200
Cells Counted: 200

## 2023-04-14 LAB — NGS JAK2 E12-15/CALR/MPL

## 2023-05-04 NOTE — Progress Notes (Deleted)
 [facility] OFFICE PROGRESS NOTE  Aurelio Brash, FNP [address] [place] [address]  DIAGNOSIS: leukocytosis  PRIOR THERAPY: None   CURRENT THERAPY: Observation  ***iron   INTERVAL HISTORY: Hannah Haney 33 y.o. female returns to clinic today for follow-up visit.  The patient established care in the clinic on 04/06/2023 for the chief complaint of leukocytosis.  She had her lab studies performed including CBC, CMP, LDH, iron, ferritin, flow cytometry, JAK2, and BCR/ABL testing.  She was scheduled to follow-up here today to review her lab results.  It was felt that her leukocytosis could be reactive secondary to smoking and obesity.  The patient denies any major changes in her health since she was last seen.  Smoking cessation? Her ferritin was low at 10. She was encouraged to ***  Overall, the patient is feeling well today.  She denies any recent illnesses.  She denies any fever, chills, night sweats, or lymphadenopathy.  She denies any other recent nasal congestion, allergies, sore throat, cough, skin infections, dysuria, skin rash or diarrhea. She denies any arthritis, unexplained weight loss, and bleeding or bruising. She occasionally has cramping abdominal pain which correlates with her menstrual cycles. She uses an albuterol inhaler as needed, but this is very rare and typically more in the winter months. She denies any steroid or hormone use. She denies any drug, environmental, or food allergies. She had back surgery 2 years ago. She denies any other recent surgeries.    MEDICAL HISTORY: Past Medical History:  Diagnosis Date   Asthma     ALLERGIES:  has no known allergies.  MEDICATIONS:  Current Outpatient Medications  Medication Sig Dispense Refill   albuterol (VENTOLIN HFA) 108 (90 Base) MCG/ACT inhaler Inhale 2 puffs into the lungs every 4 (four) hours as needed for wheezing or shortness of breath. 1 each 0   benzonatate (TESSALON) 100 MG  capsule Take 1 capsule (100 mg total) by mouth 3 (three) times daily as needed for cough. Do not take with alcohol or while driving or operating heavy machinery.  May cause drowsiness. 21 capsule 0   ibuprofen (ADVIL) 600 MG tablet Take 1 tablet (600 mg total) by mouth every 6 (six) hours as needed. 30 tablet 0   lidocaine (LIDODERM) 5 % Place 1 patch onto the skin daily. Remove & Discard patch within 12 hours or as directed by MD 30 patch 0   promethazine-dextromethorphan (PROMETHAZINE-DM) 6.25-15 MG/5ML syrup Take 5 mLs by mouth at bedtime as needed for cough. Do not take with alcohol or while driving or operating heavy machinery.  May cause drowsiness. 118 mL 0   Vitamin D, Ergocalciferol, (DRISDOL) 1.25 MG (50000 UNIT) CAPS capsule Take 50,000 Units by mouth once a week.     No current facility-administered medications for this visit.    SURGICAL HISTORY:  Past Surgical History:  Procedure Laterality Date   BACK SURGERY     LIPOMA EXCISION      REVIEW OF SYSTEMS:   Review of Systems  Constitutional: Negative for appetite change, chills, fatigue, fever and unexpected weight change.  HENT:   Negative for mouth sores, nosebleeds, sore throat and trouble swallowing.   Eyes: Negative for eye problems and icterus.  Respiratory: Negative for cough, hemoptysis, shortness of breath and wheezing.   Cardiovascular: Negative for chest pain and leg swelling.  Gastrointestinal: Negative for abdominal pain, constipation, diarrhea, nausea and vomiting.  Genitourinary: Negative for bladder incontinence, difficulty urinating, dysuria, frequency and hematuria.  Musculoskeletal: Negative for back pain, gait problem, neck pain and neck stiffness.  Skin: Negative for itching and rash.  Neurological: Negative for dizziness, extremity weakness, gait problem, headaches, light-headedness and seizures.  Hematological: Negative for adenopathy. Does not bruise/bleed easily.  Psychiatric/Behavioral: Negative for  confusion, depression and sleep disturbance. The patient is not nervous/anxious.     PHYSICAL EXAMINATION:  There were no vitals taken for this visit.  ECOG PERFORMANCE STATUS: {CHL ONC ECOG [id number]  Physical Exam  Constitutional: Oriented to person, place, and time and well-developed, well-nourished, and in no distress. No distress.  HENT:  Head: Normocephalic and atraumatic.  Mouth/Throat: Oropharynx is clear and moist. No oropharyngeal exudate.  Eyes: Conjunctivae are normal. Right eye exhibits no discharge. Left eye exhibits no discharge. No scleral icterus.  Neck: Normal range of motion. Neck supple.  Cardiovascular: Normal rate, regular rhythm, normal heart sounds and intact distal pulses.   Pulmonary/Chest: Effort normal and breath sounds normal. No respiratory distress. No wheezes. No rales.  Abdominal: Soft. Bowel sounds are normal. Exhibits no distension and no mass. There is no tenderness.  Musculoskeletal: Normal range of motion. Exhibits no edema.  Lymphadenopathy:    No cervical adenopathy.  Neurological: Alert and oriented to person, place, and time. Exhibits normal muscle tone. Gait normal. Coordination normal.  Skin: Skin is warm and dry. No rash noted. Not diaphoretic. No erythema. No pallor.  Psychiatric: Mood, memory and judgment normal.  Vitals reviewed.  LABORATORY DATA: Lab Results  Component Value Date   WBC 15.6 (H) 04/06/2023   HGB 13.3 04/06/2023   HCT 41.8 04/06/2023   MCV 80.5 04/06/2023   PLT 461 (H) 04/06/2023      Chemistry      Component Value Date/Time   NA 137 04/06/2023 1310   K 3.9 04/06/2023 1310   CL 104 04/06/2023 1310   CO2 26 04/06/2023 1310   BUN 13 04/06/2023 1310   CREATININE 0.69 04/06/2023 1310      Component Value Date/Time   CALCIUM 8.9 04/06/2023 1310   ALKPHOS 81 04/06/2023 1310   AST 16 04/06/2023 1310   ALT 19 04/06/2023 1310   BILITOT 0.6 04/06/2023 1310       RADIOGRAPHIC STUDIES:  No results  found.   ASSESSMENT/PLAN:  This is a very pleasant 33 year old African-American female referred to the clinic for thrombocytosis and leukocytosis.  She has JAK2 mutation negative as well as BCR/ABL.  It is felt that her leukocytosis is likely reactive secondary to her smoking and obesity.   She has mild thrombocytosis for which this could be related to iron deficiency as her ferritin was low at 10.  The patient will ****  Patient was seen by Dr. Shirline Frees who reviewed the results ***  Follow-up?  Strongly encouraged to quit smoking.  The patient was advised to call immediately if she has any concerning symptoms in the interval. The patient voices understanding of current disease status and treatment options and is in agreement with the current care plan. All questions were answered. The patient knows to call the clinic with any problems, questions or concerns. We can certainly see the patient much sooner if necessary     No orders of the defined types were placed in this encounter.    I spent {CHL ONC TIME VISIT - FAOZH:[id number]} counseling the patient face to face. The total time spent in the appointment was {CHL ONC TIME VISIT - EXBMW:[id number]}.  Takara Sermons L Salaya Holtrop, PA-C 05/04/23

## 2023-05-06 ENCOUNTER — Other Ambulatory Visit: Payer: Self-pay | Admitting: Physician Assistant

## 2023-05-06 DIAGNOSIS — D75839 Thrombocytosis, unspecified: Secondary | ICD-10-CM

## 2023-05-07 ENCOUNTER — Inpatient Hospital Stay: Attending: Hematology and Oncology

## 2023-05-07 ENCOUNTER — Inpatient Hospital Stay: Admitting: Physician Assistant

## 2023-05-07 ENCOUNTER — Telehealth: Payer: Self-pay

## 2023-05-07 NOTE — Telephone Encounter (Signed)
 Tried to reach patient in regards to appt today.  No answer and could not leave a VM.

## 2023-05-14 ENCOUNTER — Telehealth: Payer: Self-pay | Admitting: Physician Assistant

## 2023-05-14 NOTE — Telephone Encounter (Signed)
"  Call cannot be completed as dialed" I was unable to leave a voicemail. Patient is MyChart active with her most recent login on 04/16/2023.

## 2023-06-01 NOTE — Progress Notes (Deleted)
 [facility] OFFICE PROGRESS NOTE  Derek Flavors, FNP [address] [place] [address]  DIAGNOSIS: leukocytosis  PRIOR THERAPY: None   CURRENT THERAPY: Observation  ***iron   INTERVAL HISTORY: Keontae Ch 33 y.o. female returns to clinic today for follow-up visit.  The patient established care in the clinic on 04/06/2023 for the chief complaint of leukocytosis.  She had her lab studies performed including CBC, CMP, LDH, iron, ferritin, flow cytometry, JAK2, and BCR/ABL testing.  She was scheduled to follow-up here today to review her lab results.  It was felt that her leukocytosis could be reactive secondary to smoking and obesity.  The patient denies any major changes in her health since she was last seen.  Smoking cessation? Her ferritin was low at 10. She was encouraged to ***  Overall, the patient is feeling well today.  She denies any recent illnesses.  She denies any fever, chills, night sweats, or lymphadenopathy.  She denies any other recent nasal congestion, allergies, sore throat, cough, skin infections, dysuria, skin rash or diarrhea. She denies any arthritis, unexplained weight loss, and bleeding or bruising. She occasionally has cramping abdominal pain which correlates with her menstrual cycles. She uses an albuterol  inhaler as needed, but this is very rare and typically more in the winter months. She denies any steroid or hormone use. She denies any drug, environmental, or food allergies. She had back surgery 2 years ago. She denies any other recent surgeries.  MEDICAL HISTORY: Past Medical History:  Diagnosis Date   Asthma     ALLERGIES:  has no known allergies.  MEDICATIONS:  Current Outpatient Medications  Medication Sig Dispense Refill   albuterol  (VENTOLIN  HFA) 108 (90 Base) MCG/ACT inhaler Inhale 2 puffs into the lungs every 4 (four) hours as needed for wheezing or shortness of breath. 1 each 0   benzonatate  (TESSALON ) 100 MG capsule  Take 1 capsule (100 mg total) by mouth 3 (three) times daily as needed for cough. Do not take with alcohol or while driving or operating heavy machinery.  May cause drowsiness. 21 capsule 0   ibuprofen  (ADVIL ) 600 MG tablet Take 1 tablet (600 mg total) by mouth every 6 (six) hours as needed. 30 tablet 0   lidocaine  (LIDODERM ) 5 % Place 1 patch onto the skin daily. Remove & Discard patch within 12 hours or as directed by MD 30 patch 0   promethazine -dextromethorphan (PROMETHAZINE -DM) 6.25-15 MG/5ML syrup Take 5 mLs by mouth at bedtime as needed for cough. Do not take with alcohol or while driving or operating heavy machinery.  May cause drowsiness. 118 mL 0   Vitamin D, Ergocalciferol, (DRISDOL) 1.25 MG (50000 UNIT) CAPS capsule Take 50,000 Units by mouth once a week.     No current facility-administered medications for this visit.    SURGICAL HISTORY:  Past Surgical History:  Procedure Laterality Date   BACK SURGERY     LIPOMA EXCISION      REVIEW OF SYSTEMS:   Review of Systems  Constitutional: Negative for appetite change, chills, fatigue, fever and unexpected weight change.  HENT:   Negative for mouth sores, nosebleeds, sore throat and trouble swallowing.   Eyes: Negative for eye problems and icterus.  Respiratory: Negative for cough, hemoptysis, shortness of breath and wheezing.   Cardiovascular: Negative for chest pain and leg swelling.  Gastrointestinal: Negative for abdominal pain, constipation, diarrhea, nausea and vomiting.  Genitourinary: Negative for bladder incontinence, difficulty urinating, dysuria, frequency and hematuria.   Musculoskeletal: Negative  for back pain, gait problem, neck pain and neck stiffness.  Skin: Negative for itching and rash.  Neurological: Negative for dizziness, extremity weakness, gait problem, headaches, light-headedness and seizures.  Hematological: Negative for adenopathy. Does not bruise/bleed easily.  Psychiatric/Behavioral: Negative for  confusion, depression and sleep disturbance. The patient is not nervous/anxious.     PHYSICAL EXAMINATION:  There were no vitals taken for this visit.  ECOG PERFORMANCE STATUS: {CHL ONC ECOG [id number]  Physical Exam  Constitutional: Oriented to person, place, and time and well-developed, well-nourished, and in no distress. No distress.  HENT:  Head: Normocephalic and atraumatic.  Mouth/Throat: Oropharynx is clear and moist. No oropharyngeal exudate.  Eyes: Conjunctivae are normal. Right eye exhibits no discharge. Left eye exhibits no discharge. No scleral icterus.  Neck: Normal range of motion. Neck supple.  Cardiovascular: Normal rate, regular rhythm, normal heart sounds and intact distal pulses.   Pulmonary/Chest: Effort normal and breath sounds normal. No respiratory distress. No wheezes. No rales.  Abdominal: Soft. Bowel sounds are normal. Exhibits no distension and no mass. There is no tenderness.  Musculoskeletal: Normal range of motion. Exhibits no edema.  Lymphadenopathy:    No cervical adenopathy.  Neurological: Alert and oriented to person, place, and time. Exhibits normal muscle tone. Gait normal. Coordination normal.  Skin: Skin is warm and dry. No rash noted. Not diaphoretic. No erythema. No pallor.  Psychiatric: Mood, memory and judgment normal.  Vitals reviewed.  LABORATORY DATA: Lab Results  Component Value Date   WBC 15.6 (H) 04/06/2023   HGB 13.3 04/06/2023   HCT 41.8 04/06/2023   MCV 80.5 04/06/2023   PLT 461 (H) 04/06/2023      Chemistry      Component Value Date/Time   NA 137 04/06/2023 1310   K 3.9 04/06/2023 1310   CL 104 04/06/2023 1310   CO2 26 04/06/2023 1310   BUN 13 04/06/2023 1310   CREATININE 0.69 04/06/2023 1310      Component Value Date/Time   CALCIUM 8.9 04/06/2023 1310   ALKPHOS 81 04/06/2023 1310   AST 16 04/06/2023 1310   ALT 19 04/06/2023 1310   BILITOT 0.6 04/06/2023 1310       RADIOGRAPHIC STUDIES:  No results  found.   ASSESSMENT/PLAN:  This is a very pleasant 33 year old African-American female referred to the clinic for thrombocytosis and leukocytosis.  She has JAK2 mutation negative as well as BCR/ABL.  It is felt that her leukocytosis is likely reactive secondary to her smoking and obesity.   She has mild thrombocytosis for which this could be related to iron deficiency as her ferritin was low at 10.  The patient will ****  Patient was seen by Dr. Liam Redhead who reviewed the results ***  Follow-up?  Strongly encouraged to quit smoking.  The patient was advised to call immediately if she has any concerning symptoms in the interval. The patient voices understanding of current disease status and treatment options and is in agreement with the current care plan. All questions were answered. The patient knows to call the clinic with any problems, questions or concerns. We can certainly see the patient much sooner if necessary   No orders of the defined types were placed in this encounter.    I spent {CHL ONC TIME VISIT - GEXBM:[id number]} counseling the patient face to face. The total time spent in the appointment was {CHL ONC TIME VISIT - UVOZD:[id number]}.  Abagale Boulos L Josephyne Tarter, PA-C 06/01/23

## 2023-06-05 ENCOUNTER — Inpatient Hospital Stay

## 2023-06-05 ENCOUNTER — Inpatient Hospital Stay: Attending: Hematology and Oncology | Admitting: Physician Assistant

## 2023-06-23 ENCOUNTER — Emergency Department (HOSPITAL_BASED_OUTPATIENT_CLINIC_OR_DEPARTMENT_OTHER)

## 2023-06-23 ENCOUNTER — Encounter (HOSPITAL_BASED_OUTPATIENT_CLINIC_OR_DEPARTMENT_OTHER): Payer: Self-pay | Admitting: Urology

## 2023-06-23 ENCOUNTER — Other Ambulatory Visit: Payer: Self-pay

## 2023-06-23 ENCOUNTER — Emergency Department (HOSPITAL_BASED_OUTPATIENT_CLINIC_OR_DEPARTMENT_OTHER)
Admission: EM | Admit: 2023-06-23 | Discharge: 2023-06-23 | Disposition: A | Discharge status: Home / Self Care | Attending: Emergency Medicine | Admitting: Emergency Medicine

## 2023-06-23 DIAGNOSIS — D72829 Elevated white blood cell count, unspecified: Secondary | ICD-10-CM | POA: Diagnosis not present

## 2023-06-23 DIAGNOSIS — B9689 Other specified bacterial agents as the cause of diseases classified elsewhere: Secondary | ICD-10-CM | POA: Insufficient documentation

## 2023-06-23 DIAGNOSIS — R102 Pelvic and perineal pain: Secondary | ICD-10-CM | POA: Diagnosis present

## 2023-06-23 DIAGNOSIS — N760 Acute vaginitis: Secondary | ICD-10-CM | POA: Insufficient documentation

## 2023-06-23 LAB — URINALYSIS, ROUTINE W REFLEX MICROSCOPIC
Bilirubin Urine: NEGATIVE
Glucose, UA: NEGATIVE mg/dL
Ketones, ur: NEGATIVE mg/dL
Leukocytes,Ua: NEGATIVE
Nitrite: NEGATIVE
Protein, ur: NEGATIVE mg/dL
Specific Gravity, Urine: >=1.03 (ref 1.005–1.030)
pH: 6 (ref 5.0–8.0)

## 2023-06-23 LAB — URINALYSIS, MICROSCOPIC (REFLEX)

## 2023-06-23 LAB — WET PREP, GENITAL
Sperm: NONE SEEN
Trich, Wet Prep: NONE SEEN
WBC, Wet Prep HPF POC: <10 (ref <10)
Yeast Wet Prep HPF POC: NONE SEEN

## 2023-06-23 LAB — CBC WITH DIFFERENTIAL/PLATELET
Abs Immature Granulocytes: 0.05 10*3/uL (ref 0.00–0.07)
Basophils Absolute: 0 10*3/uL (ref 0.0–0.1)
Basophils Relative: 0 %
Eosinophils Absolute: 0.2 10*3/uL (ref 0.0–0.5)
Eosinophils Relative: 1 %
HCT: 42 % (ref 36.0–46.0)
Hemoglobin: 13.6 g/dL (ref 12.0–15.0)
Immature Granulocytes: 0 %
Lymphocytes Relative: 23 %
Lymphs Abs: 3.1 10*3/uL (ref 0.7–4.0)
MCH: 25.8 pg — ABNORMAL LOW (ref 26.0–34.0)
MCHC: 32.4 g/dL (ref 30.0–36.0)
MCV: 79.5 fL — ABNORMAL LOW (ref 80.0–100.0)
Monocytes Absolute: 1 10*3/uL (ref 0.1–1.0)
Monocytes Relative: 7 %
Neutro Abs: 9.4 10*3/uL — ABNORMAL HIGH (ref 1.7–7.7)
Neutrophils Relative %: 69 %
Platelets: 514 10*3/uL — ABNORMAL HIGH (ref 150–400)
RBC: 5.28 MIL/uL — ABNORMAL HIGH (ref 3.87–5.11)
RDW: 14.2 % (ref 11.5–15.5)
WBC: 13.8 10*3/uL — ABNORMAL HIGH (ref 4.0–10.5)
nRBC: 0 % (ref 0.0–0.2)

## 2023-06-23 LAB — COMPREHENSIVE METABOLIC PANEL WITH GFR
ALT: 9 U/L (ref 0–44)
AST: 17 U/L (ref 15–41)
Albumin: 4.1 g/dL (ref 3.5–5.0)
Alkaline Phosphatase: 77 U/L (ref 38–126)
Anion gap: 12 (ref 5–15)
BUN: 9 mg/dL (ref 6–20)
CO2: 23 mmol/L (ref 22–32)
Calcium: 9 mg/dL (ref 8.9–10.3)
Chloride: 106 mmol/L (ref 98–111)
Creatinine, Ser: 0.64 mg/dL (ref 0.44–1.00)
GFR, Estimated: >60 mL/min (ref >60)
Glucose, Bld: 95 mg/dL (ref 70–99)
Potassium: 3.7 mmol/L (ref 3.5–5.1)
Sodium: 141 mmol/L (ref 135–145)
Total Bilirubin: 0.2 mg/dL (ref 0.0–1.2)
Total Protein: 7.4 g/dL (ref 6.5–8.1)

## 2023-06-23 LAB — HCG, SERUM, QUALITATIVE: Preg, Serum: NEGATIVE

## 2023-06-23 LAB — LIPASE, BLOOD: Lipase: 26 U/L (ref 11–51)

## 2023-06-23 MED ORDER — METRONIDAZOLE 500 MG PO TABS: 500.0000 mg | ORAL_TABLET | Freq: Two times a day (BID) | ORAL | 0 refills | Status: AC

## 2023-06-23 MED ORDER — NAPROXEN 500 MG PO TABS: 500.0000 mg | ORAL_TABLET | Freq: Two times a day (BID) | ORAL | 0 refills | Status: AC

## 2023-06-23 MED ORDER — MORPHINE SULFATE (PF) 2 MG/ML IV SOLN
2.0000 mg | Freq: Once | INTRAVENOUS | Status: AC
  Administered 2023-06-23: 2 mg via INTRAVENOUS
  Filled 2023-06-23: qty 1

## 2023-06-23 MED ORDER — ONDANSETRON HCL 4 MG/2ML IJ SOLN
4.0000 mg | Freq: Once | INTRAMUSCULAR | Status: AC
  Administered 2023-06-23: 4 mg via INTRAVENOUS
  Filled 2023-06-23: qty 2

## 2023-06-23 MED ORDER — KETOROLAC TROMETHAMINE 15 MG/ML IJ SOLN
15.0000 mg | Freq: Once | INTRAMUSCULAR | Status: AC
  Administered 2023-06-23: 15 mg via INTRAVENOUS
  Filled 2023-06-23: qty 1

## 2023-06-23 NOTE — ED Notes (Signed)
 Pt. Complains of R groin pain x approx. 1 mth.  Pt. Reports she felt like she has pulled something and thought it might get better but it has not.

## 2023-06-23 NOTE — ED Provider Notes (Signed)
 [facility] Provider Note   CSN: [id number] Arrival date & time: 06/23/23  1502     History  Chief Complaint  Patient presents with   Pelvic Pain    Hannah Haney is a 33 y.o. female.  G1, P0 presenting to emergency room with complaint of right sided pelvic pain.  Patient reports that this started 2 or 3 weeks ago but acutely worsened yesterday evening.  She reports it is quite uncomfortable and moving her leg or sitting up. Reports she is on wegovy and has some nausea at baseline, not acutely worsened with pain. She has not noticed any vomiting diarrhea.  Reports just finished LMP yesterday.  Reports she is not sexually active.  She is not on any form of birth control.  Has follow-up with OB/GYN Thursday.    Pelvic Pain       Home Medications Prior to Admission medications   Medication Sig Start Date End Date Taking? Authorizing Provider  albuterol  (VENTOLIN  HFA) 108 (90 Base) MCG/ACT inhaler Inhale 2 puffs into the lungs every 4 (four) hours as needed for wheezing or shortness of breath. 10/08/22   Wilhemena Harbour, NP  benzonatate  (TESSALON ) 100 MG capsule Take 1 capsule (100 mg total) by mouth 3 (three) times daily as needed for cough. Do not take with alcohol or while driving or operating heavy machinery.  May cause drowsiness. 10/08/22   Wilhemena Harbour, NP  ibuprofen  (ADVIL ) 600 MG tablet Take 1 tablet (600 mg total) by mouth every 6 (six) hours as needed. 02/14/23   [place] Butter, PA  lidocaine  (LIDODERM ) 5 % Place 1 patch onto the skin daily. Remove & Discard patch within 12 hours or as directed by MD 01/21/22   Small, Brooke L, PA  promethazine -dextromethorphan (PROMETHAZINE -DM) 6.25-15 MG/5ML syrup Take 5 mLs by mouth at bedtime as needed for cough. Do not take with alcohol or while driving or operating heavy machinery.  May cause drowsiness. 10/08/22   Wilhemena Harbour, NP  Vitamin D, Ergocalciferol, (DRISDOL) 1.25  MG (50000 UNIT) CAPS capsule Take 50,000 Units by mouth once a week. 12/18/21   [provider]  dicyclomine  (BENTYL ) 20 MG tablet Take 1 tablet (20 mg total) by mouth 2 (two) times daily. 06/15/18 05/19/19  Law, Alexandra M, PA-C      Allergies    Patient has no known allergies.    Review of Systems   Review of Systems  Genitourinary:  Positive for pelvic pain.    Physical Exam Updated Vital Signs BP 110/63 (BP Location: Left Arm)   Pulse 85   Temp 98 F (36.7 C)   Resp 20   Ht 5\' 3"  (1.6 m)   Wt 117.6 kg   LMP 06/22/2023   SpO2 99%   BMI 45.93 kg/m  Physical Exam Vitals and nursing note reviewed.  Constitutional:      General: She is not in acute distress.    Appearance: She is not toxic-appearing.  HENT:     Head: Normocephalic and atraumatic.  Eyes:     General: No scleral icterus.    Conjunctiva/sclera: Conjunctivae normal.  Cardiovascular:     Rate and Rhythm: Normal rate and regular rhythm.     Pulses: Normal pulses.     Heart sounds: Normal heart sounds.  Pulmonary:     Effort: Pulmonary effort is normal. No respiratory distress.     Breath sounds: Normal breath sounds.  Abdominal:  General: Abdomen is flat. Bowel sounds are normal.     Palpations: Abdomen is soft.     Tenderness: There is no abdominal tenderness.     Comments: Right lower pelvic pain near inguinal fold, mild TTP. Negative McBurney's, negative Rovsing's.   Musculoskeletal:     Right lower leg: No edema.     Left lower leg: No edema.  Skin:    General: Skin is warm and dry.     Findings: No lesion.  Neurological:     General: No focal deficit present.     Mental Status: She is alert and oriented to person, place, and time. Mental status is at baseline.     ED Results / Procedures / Treatments   Labs (all labs ordered are listed, but only abnormal results are displayed) Labs Reviewed  WET PREP, GENITAL - Abnormal; Notable for the following components:      Result Value    Clue Cells Wet Prep HPF POC PRESENT (*)    All other components within normal limits  CBC WITH DIFFERENTIAL/PLATELET - Abnormal; Notable for the following components:   WBC 13.8 (*)    RBC 5.28 (*)    MCV 79.5 (*)    MCH 25.8 (*)    Platelets 514 (*)    Neutro Abs 9.4 (*)    All other components within normal limits  URINALYSIS, ROUTINE W REFLEX MICROSCOPIC - Abnormal; Notable for the following components:   Hgb urine dipstick SMALL (*)    All other components within normal limits  URINALYSIS, MICROSCOPIC (REFLEX) - Abnormal; Notable for the following components:   Bacteria, UA FEW (*)    All other components within normal limits  COMPREHENSIVE METABOLIC PANEL WITH GFR  LIPASE, BLOOD  HCG, SERUM, QUALITATIVE  GC/CHLAMYDIA PROBE AMP ([place]) NOT AT [facility]    EKG None  Radiology US  PELVIC COMPLETE W TRANSVAGINAL AND TORSION R/O Result Date: 06/23/2023 CLINICAL DATA:  [id number] Right lower quadrant pain [id number] EXAM: ULTRASOUND OF PELVIS TECHNIQUE: Transabdominal and transvaginalultrasound examination of the pelvis was performed including evaluation of the uterus, ovaries, adnexal regions, and pelvic cul-de-sac. COMPARISON:  08/16/2019. FINDINGS: Uterusanteverted measuring 10 x 6 x 5 cm. The endometrium 0.9 cm. Endometrium displaced by submucosal fibroids. The uterine cavity is empty. The following uterine fibroids were identified. 1. Anterior intramural, 2.3 x 2.0 x 1.1 cm. 2. Anterior submucosal, 1.7 x 1.6 x 1.3 cm. 3. Left-sided submucosal, 3.1 x 3.2 x 2.7 cm. Right ovary Unremarkable, 2.3 x 1.7 x 1.6 cm. Left ovary Unremarkable, 2.7 x 2.2 x 1.7 cm. Images of the adnexae demonstrated no masses or fluid collections. Color Doppler demonstrated ovarian blood flow. IMPRESSION: Uterine fibroids. Otherwise unremarkable examination of the pelvis. Electronically Signed   By: Sydell Eva M.D.   On: 06/23/2023 18:47    Procedures Procedures    Medications Ordered in ED Medications -  No data to display  ED Course/ Medical Decision Making/ A&P Clinical Course as of 06/23/23 1902  Tue Jun 23, 2023  1820 Clue Cells Wet Prep HPF POC(!): PRESENT [JB]  1823 Patient reassessed.  Discussed labs. Still waiting for pelvis US  results. Reports she is still having moderate discomfort requesting further pain medicine.  Toradol  ordered. [JB]  1900 Feeling better. US  shows no acute findings. Tolerating oral intake, has good follow up. Feel appropriate for discharge.  [JB]    Clinical Course User Index [JB] Treshun Wold, Kandace Organ, PA-C  Medical Decision Making Amount and/or Complexity of Data Reviewed Labs: ordered. Decision-making details documented in ED Course. Radiology: ordered.  Risk Prescription drug management.   This patient presents to the ED for concern of abdominal pain, this involves an extensive number of treatment options, and is a complaint that carries with it a high risk of complications and morbidity.  The differential diagnosis includes torsion, appendicitis, cholecystitis, small bowel obstruction, urinary tract infection, pelvic inflammatory disease, ectopic, normal pregnancy    Lab Tests:  I personally interpreted labs.  The pertinent results include:   CBC with mild leukocytosis.  Patient has history of leukocytosis and this is not elevated from her baseline.  Hemoglobin is stable.  CMP without significant electrolyte abnormality.  Normal kidney function normal liver function.  UA with small amount of bacteria but denies UTI-like symptoms.  Gonorrhea chlamydia and wet prep obtained.  hCG negative lipase within normal limits   Imaging Studies ordered:  I ordered imaging studies including Pelvic US  to r/u torsion   Uterine fibroid.    Cardiac Monitoring: / EKG:  The patient was maintained on a cardiac monitor.    Problem List / ED Course / Critical interventions / Medication management  Patient is presenting with right  lower quadrant abdominal pain.  Negative McBurneys and negative Rovsings thus do not think CT needed at this time. She has history of similar but has acute worsening of symptoms over the past 1 to 2 days.  She has no associated nausea vomiting or diarrhea.  She is tolerating oral intake.  Her vitals are stable and she is well-appearing.  Symptoms are worse with movement and radiate down into her leg so I am suspicious that this is muscular in nature however given location will obtain ultrasound to rule out ovarian torsion or other pelvic abnormality.  Labs are overall reassuring.  Does have small amount of bacteria in urine but denies UTI-like symptoms.  Gonorrhea chlamydia pending.  Wet prep pending.  Reevaluation of the patient after these medicines showed that the patient improved I have reviewed the patients home medicines and have made adjustments as needed         Final Clinical Impression(s) / ED Diagnoses Final diagnoses:  BV (bacterial vaginosis)    Rx / DC Orders ED Discharge Orders          Ordered    metroNIDAZOLE  (FLAGYL ) 500 MG tablet  2 times daily        06/23/23 1732    naproxen (NAPROSYN) 500 MG tablet  2 times daily        06/23/23 1901              Cyrstal Leitz N, PA-C 06/23/23 1904    Tonya Fredrickson, MD 06/24/23 1047

## 2023-06-23 NOTE — ED Triage Notes (Signed)
 Pt states right side pelvic pain x 2 month  Denies any vaginal issues or urinary complaints  States now pain radiating down right leg   On wygovi, pain started prior to taking

## 2023-06-23 NOTE — Discharge Instructions (Addendum)
 Your swabs suggest that you have bacterial vaginosis.  I have sent an antibiotic to your pharmacy please take as prescribed.  You cannot drink alcohol with this medicine or make you sick.  Return to ER with new or worsening symptoms.   Follow up with OBGYN, as discussed. Alternate Tylenol  and Naproxen for pain control.

## 2023-06-24 LAB — GC/CHLAMYDIA PROBE AMP (~~LOC~~) NOT AT ARMC
Chlamydia: NEGATIVE
Comment: NEGATIVE
Comment: NORMAL
Neisseria Gonorrhea: NEGATIVE

## 2023-07-19 ENCOUNTER — Other Ambulatory Visit: Payer: Self-pay

## 2023-07-19 ENCOUNTER — Emergency Department (HOSPITAL_BASED_OUTPATIENT_CLINIC_OR_DEPARTMENT_OTHER)
Admission: EM | Admit: 2023-07-19 | Discharge: 2023-07-19 | Disposition: A | Discharge status: Home / Self Care | Attending: Emergency Medicine | Admitting: Emergency Medicine

## 2023-07-19 ENCOUNTER — Encounter (HOSPITAL_BASED_OUTPATIENT_CLINIC_OR_DEPARTMENT_OTHER): Payer: Self-pay | Admitting: Emergency Medicine

## 2023-07-19 DIAGNOSIS — R21 Rash and other nonspecific skin eruption: Secondary | ICD-10-CM

## 2023-07-19 DIAGNOSIS — L03114 Cellulitis of left upper limb: Secondary | ICD-10-CM | POA: Insufficient documentation

## 2023-07-19 MED ORDER — PREDNISONE 10 MG PO TABS: ORAL_TABLET | ORAL | 0 refills | Status: AC

## 2023-07-19 MED ORDER — CEPHALEXIN 500 MG PO CAPS: 500.0000 mg | ORAL_CAPSULE | Freq: Two times a day (BID) | ORAL | 0 refills | Status: AC

## 2023-07-19 MED ORDER — PREDNISONE 10 MG PO TABS: ORAL_TABLET | ORAL | 0 refills | Status: DC

## 2023-07-19 MED ORDER — DIPHENHYDRAMINE HCL 25 MG PO CAPS
25.0000 mg | ORAL_CAPSULE | Freq: Once | ORAL | Status: AC
  Administered 2023-07-19: 25 mg via ORAL
  Filled 2023-07-19: qty 1

## 2023-07-19 MED ORDER — PREDNISONE 20 MG PO TABS
40.0000 mg | ORAL_TABLET | Freq: Once | ORAL | Status: AC
  Administered 2023-07-19: 40 mg via ORAL
  Filled 2023-07-19: qty 2

## 2023-07-19 MED ORDER — CEPHALEXIN 250 MG PO CAPS
500.0000 mg | ORAL_CAPSULE | Freq: Once | ORAL | Status: AC
  Administered 2023-07-19: 500 mg via ORAL
  Filled 2023-07-19: qty 2

## 2023-07-19 MED ORDER — CEPHALEXIN 500 MG PO CAPS: 500.0000 mg | ORAL_CAPSULE | Freq: Two times a day (BID) | ORAL | 0 refills | Status: DC

## 2023-07-19 NOTE — Discharge Instructions (Signed)
 You have been prescribed prednisone . Please take this medication as prescribed for the next 7 days (40mg  on days 1 and 2, 30mg  on days 3 and 4, 20mg  on days 5 and 6, 10mg  on day 7).  You were given your first dose here today, you may take your next dose tomorrow morning.  Take this medication in the morning with breakfast, as taking it at night may make it hard to sleep. If you are a diabetic, please monitor your blood sugars closely on this medication, as it can cause your blood sugar to rise. If your blood sugars rise above 400, stop taking the medication and go to the ER.   You have been prescribed an antibiotic called cephalexin (Keflex) as the area does look mildly infected.  Take this antibiotic 2 times a day for the next 5 days.  You were given your first dose here today.  You may take your next dose this evening.  Take the full course of your antibiotic even if you start feeling better. Antibiotics may cause you to have diarrhea.  You may take 10 mg of cetirizine (Zyrtec) daily to help with itching.  This is an over-the-counter medication.  This is less sedating than Benadryl .   Please follow-up with your PCP if symptoms are not starting to improve within the next 5 days.  Return to the ER for any severe rash, fevers, red streaking down your arm, any other new or concerning symptoms

## 2023-07-19 NOTE — ED Notes (Signed)
 Reviewed discharge instructions, follow up and medications with pt. Pt states understanding. Ambulatory at discharge in good condition

## 2023-07-19 NOTE — ED Triage Notes (Signed)
 Pt c/o rash to LT upper arm since last night; thinks she was bitten by something; c/o pain and itching

## 2023-07-19 NOTE — ED Provider Notes (Signed)
  EMERGENCY DEPARTMENT AT [facility] Provider Note   CSN: [id number] Arrival date & time: 07/19/23  1207     Patient presents with: Rash   Hannah Haney is a 33 y.o. female with no significant past medical history presents with concern for a red itchy area to her left upper arm that she noticed last night.  States she was sitting outside where there were a lot of bugs, and thought she may have been bitten by mosquito.  Overnight, the area started to get red and itchy.  Denies any fevers or chills.  Denies any red streaking in the arm.  Denies any new soaps, lotions, medications, or foods.  Denies any feelings of throat closure or shortness of breath.  Denies any nausea or vomiting.    Rash      Prior to Admission medications   Medication Sig Start Date End Date Taking? Authorizing Provider  cephALEXin (KEFLEX) 500 MG capsule Take 1 capsule (500 mg total) by mouth 2 (two) times daily for 5 days. 07/19/23 07/24/23 Yes Veta Palma, PA-C  predniSONE  (DELTASONE ) 10 MG tablet Take 4 tablets (40 mg total) by mouth daily with breakfast for 1 day, THEN 3 tablets (30 mg total) daily with breakfast for 2 days, THEN 2 tablets (20 mg total) daily with breakfast for 2 days, THEN 1 tablet (10 mg total) daily with breakfast for 1 day. 07/20/23 07/26/23 Yes Veta Palma, PA-C  albuterol  (VENTOLIN  HFA) 108 (90 Base) MCG/ACT inhaler Inhale 2 puffs into the lungs every 4 (four) hours as needed for wheezing or shortness of breath. 10/08/22   Chandra Harlene LABOR, NP  benzonatate  (TESSALON ) 100 MG capsule Take 1 capsule (100 mg total) by mouth 3 (three) times daily as needed for cough. Do not take with alcohol or while driving or operating heavy machinery.  May cause drowsiness. 10/08/22   Chandra Harlene LABOR, NP  ibuprofen  (ADVIL ) 600 MG tablet Take 1 tablet (600 mg total) by mouth every 6 (six) hours as needed. 02/14/23   Silver Fell A, PA  lidocaine  (LIDODERM ) 5 % Place 1 patch  onto the skin daily. Remove & Discard patch within 12 hours or as directed by MD 01/21/22   Small, Brooke L, PA  metroNIDAZOLE  (FLAGYL ) 500 MG tablet Take 1 tablet (500 mg total) by mouth 2 (two) times daily. 06/23/23   Barrett, Jamie N, PA-C  naproxen  (NAPROSYN ) 500 MG tablet Take 1 tablet (500 mg total) by mouth 2 (two) times daily. 06/23/23   Barrett, Jamie N, PA-C  promethazine -dextromethorphan (PROMETHAZINE -DM) 6.25-15 MG/5ML syrup Take 5 mLs by mouth at bedtime as needed for cough. Do not take with alcohol or while driving or operating heavy machinery.  May cause drowsiness. 10/08/22   Chandra Harlene LABOR, NP  Vitamin D, Ergocalciferol, (DRISDOL) 1.25 MG (50000 UNIT) CAPS capsule Take 50,000 Units by mouth once a week. 12/18/21   [provider]  dicyclomine  (BENTYL ) 20 MG tablet Take 1 tablet (20 mg total) by mouth 2 (two) times daily. 06/15/18 05/19/19  Law, Alexandra M, PA-C    Allergies: Patient has no known allergies.    Review of Systems  Skin:  Positive for rash.    Updated Vital Signs BP 113/69   Pulse 97   Temp (!) 97.4 F (36.3 C)   Resp 18   Ht 5' 3 (1.6 m)   Wt 117.6 kg   LMP 06/22/2023   SpO2 97%   BMI 45.93 kg/m   Physical Exam Vitals  and nursing note reviewed.  Constitutional:      Appearance: Normal appearance.  HENT:     Head: Atraumatic.   Cardiovascular:     Rate and Rhythm: Normal rate and regular rhythm.     Comments: Radial pulse 2+ bilaterally  Pulmonary:     Effort: Pulmonary effort is normal.   Musculoskeletal:     Comments: Full range of motion of the upper extremities bilaterally   Skin:    Comments: Erythematous circular patch of skin that is well-demarcated on the left upper arm with mild raised macules. Approximately 7 cm in diameter.  No areas of fluctuance.  Tender to palpation.  No red streaking.   Neurological:     General: No focal deficit present.     Mental Status: She is alert.     Comments: Intact sensation of the  bilateral upper extremities  Psychiatric:        Mood and Affect: Mood normal.        Behavior: Behavior normal.     (all labs ordered are listed, but only abnormal results are displayed) Labs Reviewed - No data to display  EKG: None  Radiology: No results found.   Procedures   Medications Ordered in the ED  cephALEXin (KEFLEX) capsule 500 mg (500 mg Oral Given 07/19/23 1340)  predniSONE  (DELTASONE ) tablet 40 mg (40 mg Oral Given 07/19/23 1341)  diphenhydrAMINE  (BENADRYL ) capsule 25 mg (25 mg Oral Given 07/19/23 1339)                                    Medical Decision Making Risk Prescription drug management.     Differential diagnosis includes but is not limited to bug bite, cellulitis, contact dermatitis, septic arthritis, sepsis, abscess  ED Course:  Upon initial evaluation, patient is well-appearing, stable vitals.  She has a approximately 7 cm diameter circular erythematous patch of skin on the upper left arm.  Mild overlying macules.  This area is tender to palpation and also reportedly itchy.  No areas of fluctuance, no concern for abscess.  No red streaking.  No tachycardia, fever, erythema localized to the upper arm, low concern for systemic infection. Unclear if this is more cellulitis vs non-specific rash at this time due to the patient's darker complexion.  Will treat with course of prednisone  and Keflex.  Stable and appropriate for discharge home   Medications Given: Keflex, prednisone , Benadryl   Impression: Left arm  Disposition:  The patient was discharged home with instructions to take course of Keflex and prednisone  as prescribed.  May take either Benadryl  or Zyrtec for itching.  Follow-up with PCP if symptoms not improved within the next 5 days. Return precautions given.    This chart was dictated using voice recognition software, Dragon. Despite the best efforts of this provider to proofread and correct errors, errors may still occur which can  change documentation meaning.       Final diagnoses:  Cellulitis of left upper arm  Rash    ED Discharge Orders          Ordered    predniSONE  (DELTASONE ) 10 MG tablet  Q breakfast        07/19/23 1358    cephALEXin (KEFLEX) 500 MG capsule  2 times daily        07/19/23 1358               Veta Palma, PA-C 07/19/23  1411    Elnor Jayson LABOR, DO 07/21/23 [place]

## 2023-08-17 ENCOUNTER — Other Ambulatory Visit: Payer: Self-pay

## 2023-08-17 ENCOUNTER — Encounter (HOSPITAL_BASED_OUTPATIENT_CLINIC_OR_DEPARTMENT_OTHER): Payer: Self-pay | Admitting: Urology

## 2023-08-17 ENCOUNTER — Emergency Department (HOSPITAL_BASED_OUTPATIENT_CLINIC_OR_DEPARTMENT_OTHER)
Admission: EM | Admit: 2023-08-17 | Discharge: 2023-08-17 | Disposition: A | Discharge status: Home / Self Care | Attending: Emergency Medicine | Admitting: Emergency Medicine

## 2023-08-17 DIAGNOSIS — J069 Acute upper respiratory infection, unspecified: Secondary | ICD-10-CM | POA: Insufficient documentation

## 2023-08-17 DIAGNOSIS — R059 Cough, unspecified: Secondary | ICD-10-CM | POA: Diagnosis present

## 2023-08-17 DIAGNOSIS — J45909 Unspecified asthma, uncomplicated: Secondary | ICD-10-CM | POA: Insufficient documentation

## 2023-08-17 DIAGNOSIS — Z20822 Contact with and (suspected) exposure to covid-19: Secondary | ICD-10-CM | POA: Diagnosis not present

## 2023-08-17 LAB — RESP PANEL BY RT-PCR (RSV, FLU A&B, COVID)  RVPGX2
Influenza A by PCR: NEGATIVE
Influenza B by PCR: NEGATIVE
Resp Syncytial Virus by PCR: NEGATIVE
SARS Coronavirus 2 by RT PCR: NEGATIVE

## 2023-08-17 NOTE — Discharge Instructions (Signed)
 Please read and follow all provided instructions.  Your diagnoses today include:  1. Upper respiratory tract infection, unspecified type     You appear to have an upper respiratory infection (URI). An upper respiratory tract infection, or cold, is a viral infection of the air passages leading to the lungs. It should improve gradually after 5-7 days. You may have a lingering cough that lasts for 2- 4 weeks after the infection.  Tests performed today include: Vital signs. See below for your results today.  COVID, flu, RSV testing was negative  Medications prescribed:  None  Take any prescribed medications only as directed. Treatment for your infection is aimed at treating the symptoms. There are no medications, such as antibiotics, that will cure your infection.   Home care instructions:  You can take Tylenol  and/or Ibuprofen  as directed on the packaging for fever reduction and pain relief.    For cough: honey 1/2 to 1 teaspoon (you can dilute the honey in water or another fluid).  You can also use guaifenesin and dextromethorphan for cough. You can use a humidifier for chest congestion and cough.  If you don't have a humidifier, you can sit in the bathroom with the hot shower running.      For sore throat: try warm salt water gargles, cepacol lozenges, throat spray, warm tea or water with lemon/honey, popsicles or ice, or OTC cold relief medicine for throat discomfort.    For congestion: take a daily anti-histamine like Zyrtec, Claritin, and a oral decongestant, such as pseudoephedrine.  You can also use Flonase 1-2 sprays in each nostril daily.    It is important to stay hydrated: drink plenty of fluids (water, gatorade/powerade/pedialyte, juices, or teas) to keep your throat moisturized and help further relieve irritation/discomfort.   Your illness is contagious and can be spread to others, especially during the first 3 or 4 days. It cannot be cured by antibiotics or other medicines.  Take basic precautions such as washing your hands often, covering your mouth when you cough or sneeze, and avoiding public places where you could spread your illness to others.   Please continue drinking plenty of fluids.  Use over-the-counter medicines as needed as directed on packaging for symptom relief.  You may also use ibuprofen  or tylenol  as directed on packaging for pain or fever.  Do not take multiple medicines containing Tylenol  or acetaminophen  to avoid taking too much of this medication.  Follow-up instructions: Please follow-up with your primary care provider in the next 3 days for further evaluation of your symptoms if you are not feeling better.   Return instructions:  Please return to the Emergency Department if you experience worsening symptoms.  RETURN IMMEDIATELY IF you develop shortness of breath, confusion or altered mental status, a new rash, become dizzy, faint, or poorly responsive, or are unable to be cared for at home. Please return if you have persistent vomiting and cannot keep down fluids or develop a fever that is not controlled by tylenol  or motrin .   Please return if you have any other emergent concerns.  Additional Information:  Your vital signs today were: BP 100/69   Pulse 77   Temp 98.1 F (36.7 C) (Oral)   Resp 18   Ht 5' 3 (1.6 m)   Wt 117.6 kg   SpO2 100%   BMI 45.93 kg/m  If your blood pressure (BP) was elevated above 135/85 this visit, please have this repeated by your doctor within one month. --------------

## 2023-08-17 NOTE — ED Provider Notes (Signed)
 [place] EMERGENCY DEPARTMENT AT [facility] Provider Note   CSN: [id number] Arrival date & time: 08/17/23  1115     Patient presents with: Covid Exposure   Hannah Haney is a 33 y.o. female.   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  Patient states that she has had a runny nose for 2 or 3 days.  This morning she awoke with a scratchy throat.  She has occasional cough.  She has a history of asthma but no wheezing currently and she feels that this is controlled.  No fevers.  No vomiting or diarrhea.  She wanted to be checked for COVID.       Prior to Admission medications   Medication Sig Start Date End Date Taking? Authorizing Provider  albuterol  (VENTOLIN  HFA) 108 (90 Base) MCG/ACT inhaler Inhale 2 puffs into the lungs every 4 (four) hours as needed for wheezing or shortness of breath. 10/08/22   Chandra Harlene LABOR, NP  benzonatate  (TESSALON ) 100 MG capsule Take 1 capsule (100 mg total) by mouth 3 (three) times daily as needed for cough. Do not take with alcohol or while driving or operating heavy machinery.  May cause drowsiness. 10/08/22   Chandra Harlene LABOR, NP  ibuprofen  (ADVIL ) 600 MG tablet Take 1 tablet (600 mg total) by mouth every 6 (six) hours as needed. 02/14/23   Silver Wonda LABOR, PA  lidocaine  (LIDODERM ) 5 % Place 1 patch onto the skin daily. Remove & Discard patch within 12 hours or as directed by MD 01/21/22   Small, Brooke L, PA  metroNIDAZOLE  (FLAGYL ) 500 MG tablet Take 1 tablet (500 mg total) by mouth 2 (two) times daily. 06/23/23   Barrett, Warren SAILOR, PA-C  naproxen  (NAPROSYN ) 500 MG tablet Take 1 tablet (500 mg total) by mouth 2 (two) times daily. 06/23/23   Barrett, Jamie N, PA-C  promethazine -dextromethorphan (PROMETHAZINE -DM) 6.25-15 MG/5ML syrup Take 5 mLs by mouth at bedtime as needed for cough. Do not take with alcohol or while driving or operating heavy  machinery.  May cause drowsiness. 10/08/22   Chandra Harlene LABOR, NP  Vitamin D, Ergocalciferol, (DRISDOL) 1.25 MG (50000 UNIT) CAPS capsule Take 50,000 Units by mouth once a week. 12/18/21   [provider]  dicyclomine  (BENTYL ) 20 MG tablet Take 1 tablet (20 mg total) by mouth 2 (two) times daily. 06/15/18 05/19/19  Law, Alexandra M, PA-C    Allergies: Patient has no known allergies.    Review of Systems  Updated Vital Signs BP 100/69   Pulse 77   Temp 98.1 F (36.7 C) (Oral)   Resp 18   Ht 5' 3 (1.6 m)   Wt 117.6 kg   SpO2 100%   BMI 45.93 kg/m   Physical Exam Vitals and nursing note reviewed.  Constitutional:      Appearance: She is well-developed.  HENT:     Head: Normocephalic and atraumatic.     Jaw: No trismus.     Right Ear: Tympanic membrane, ear canal and external ear normal.     Left Ear: Tympanic membrane, ear canal and external ear normal.     Nose: Congestion present. No mucosal edema or rhinorrhea.     Mouth/Throat:     Mouth: Mucous membranes are moist. Mucous membranes are not dry. No oral lesions.     Pharynx: Uvula midline. No oropharyngeal exudate, posterior oropharyngeal erythema or uvula swelling.  Tonsils: No tonsillar abscesses.  Eyes:     General:        Right eye: No discharge.        Left eye: No discharge.     Conjunctiva/sclera: Conjunctivae normal.  Cardiovascular:     Rate and Rhythm: Normal rate and regular rhythm.     Heart sounds: Normal heart sounds.  Pulmonary:     Effort: Pulmonary effort is normal. No respiratory distress.     Breath sounds: Normal breath sounds. No wheezing or rales.     Comments: Lungs clear no wheezing, no respiratory distress Abdominal:     Palpations: Abdomen is soft.     Tenderness: There is no abdominal tenderness.  Musculoskeletal:     Cervical back: Normal range of motion and neck supple.  Lymphadenopathy:     Cervical: No cervical adenopathy.  Skin:    General: Skin is warm and dry.   Neurological:     Mental Status: She is alert.  Psychiatric:        Mood and Affect: Mood normal.     (all labs ordered are listed, but only abnormal results are displayed) Labs Reviewed  RESP PANEL BY RT-PCR (RSV, FLU A&B, COVID)  [id number]    EKG: None  Radiology: No results found.   Procedures   Medications Ordered in the ED - No data to display  ED Course  Patient seen and examined. History obtained directly from patient. Work-up including labs, imaging, EKG ordered in triage, if performed, were reviewed.    Labs/EKG: Independently reviewed and interpreted.  This included: Viral panel negative for COVID, flu, RSV  Imaging: None ordered  Medications/Fluids: None ordered  Most recent vital signs reviewed and are as follows: BP 100/69   Pulse 77   Temp 98.1 F (36.7 C) (Oral)   Resp 18   Ht 5' 3 (1.6 m)   Wt 117.6 kg   SpO2 100%   BMI 45.93 kg/m   Initial impression: URI  Home treatment plan: OTC meds  Return instructions discussed with patient: New or worsening symptoms  Follow-up instructions discussed with patient: PCP as needed                                   Medical Decision Making  Patient with symptoms consistent with a viral syndrome. Vitals are stable, no fever. No signs of dehydration. Lung exam normal, no signs of pneumonia. Supportive therapy indicated with return if symptoms worsen.        Final diagnoses:  Upper respiratory tract infection, unspecified type    ED Discharge Orders     None          Desiderio Chew, PA-C 08/17/23 1219    Emil Share, DO 08/17/23 1351

## 2023-08-17 NOTE — ED Triage Notes (Signed)
 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

## 2023-10-16 ENCOUNTER — Other Ambulatory Visit: Payer: Self-pay

## 2023-10-16 ENCOUNTER — Encounter (HOSPITAL_BASED_OUTPATIENT_CLINIC_OR_DEPARTMENT_OTHER): Payer: Self-pay | Admitting: Emergency Medicine

## 2023-10-16 DIAGNOSIS — Z5321 Procedure and treatment not carried out due to patient leaving prior to being seen by health care provider: Secondary | ICD-10-CM | POA: Insufficient documentation

## 2023-10-16 DIAGNOSIS — R1030 Lower abdominal pain, unspecified: Secondary | ICD-10-CM | POA: Diagnosis present

## 2023-10-16 LAB — URINALYSIS, MICROSCOPIC (REFLEX)
RBC / HPF: >50 RBC/hpf (ref 0–5)
WBC, UA: NONE SEEN WBC/hpf (ref 0–5)

## 2023-10-16 LAB — URINALYSIS, ROUTINE W REFLEX MICROSCOPIC
Bilirubin Urine: NEGATIVE
Glucose, UA: NEGATIVE mg/dL
Ketones, ur: NEGATIVE mg/dL
Leukocytes,Ua: NEGATIVE
Nitrite: NEGATIVE
Protein, ur: 100 mg/dL — AB
Specific Gravity, Urine: >=1.03 (ref 1.005–1.030)
pH: 6 (ref 5.0–8.0)

## 2023-10-16 NOTE — ED Triage Notes (Signed)
 Pt c/o R groin pain, pain with bearing weight, movement, sitting; intermittent every other week for a day or two. This instance started last night with cramping.   Denies swelling, urinary sx,

## 2023-10-17 ENCOUNTER — Emergency Department (HOSPITAL_BASED_OUTPATIENT_CLINIC_OR_DEPARTMENT_OTHER)
Admission: EM | Admit: 2023-10-17 | Discharge: 2023-10-17 | Discharge status: Against Medical Advice | Attending: Emergency Medicine | Admitting: Emergency Medicine

## 2023-10-17 NOTE — ED Notes (Signed)
Called 3x for room placement. Eloped from waiting area.  

## 2023-11-30 ENCOUNTER — Encounter: Payer: Self-pay | Admitting: Radiology
